# Patient Record
Sex: Female | Born: 1955 | Hispanic: Yes | Marital: Single | Smoking: Never smoker
Health system: Southern US, Community
[De-identification: ages and names within clinical notes are randomized; demographics above are authoritative.]

## PROBLEM LIST (undated history)

## (undated) DIAGNOSIS — I10 Essential (primary) hypertension: Secondary | ICD-10-CM

## (undated) DIAGNOSIS — E7800 Pure hypercholesterolemia, unspecified: Secondary | ICD-10-CM

## (undated) HISTORY — PX: SHOULDER SURGERY: SHX246

## (undated) HISTORY — DX: Pure hypercholesterolemia, unspecified: E78.00

## (undated) HISTORY — PX: FOOT SURGERY: SHX648

## (undated) HISTORY — PX: ABDOMINAL HYSTERECTOMY: SHX81

---

## 2013-08-12 ENCOUNTER — Emergency Department (HOSPITAL_COMMUNITY): Payer: Medicaid - Out of State

## 2013-08-12 ENCOUNTER — Encounter (HOSPITAL_COMMUNITY): Payer: Self-pay | Admitting: Emergency Medicine

## 2013-08-12 ENCOUNTER — Emergency Department (HOSPITAL_COMMUNITY)
Admission: EM | Admit: 2013-08-12 | Discharge: 2013-08-12 | Disposition: A | Discharge status: Home / Self Care | Payer: Medicaid - Out of State | Attending: Emergency Medicine | Admitting: Emergency Medicine

## 2013-08-12 DIAGNOSIS — R0602 Shortness of breath: Secondary | ICD-10-CM | POA: Insufficient documentation

## 2013-08-12 DIAGNOSIS — R079 Chest pain, unspecified: Secondary | ICD-10-CM

## 2013-08-12 DIAGNOSIS — I10 Essential (primary) hypertension: Secondary | ICD-10-CM | POA: Insufficient documentation

## 2013-08-12 HISTORY — DX: Essential (primary) hypertension: I10

## 2013-08-12 LAB — I-STAT TROPONIN, ED
TROPONIN I, POC: 0.01 ng/mL (ref 0.00–0.08)
Troponin i, poc: 0.01 ng/mL (ref 0.00–0.08)

## 2013-08-12 LAB — BASIC METABOLIC PANEL
Anion gap: 13 (ref 5–15)
BUN: 10 mg/dL (ref 6–23)
CALCIUM: 9.8 mg/dL (ref 8.4–10.5)
CO2: 26 meq/L (ref 19–32)
CREATININE: 0.72 mg/dL (ref 0.50–1.10)
Chloride: 102 mEq/L (ref 96–112)
GFR calc Af Amer: >90 mL/min (ref >90)
GLUCOSE: 112 mg/dL — AB (ref 70–99)
Potassium: 3.3 mEq/L — ABNORMAL LOW (ref 3.7–5.3)
Sodium: 141 mEq/L (ref 137–147)

## 2013-08-12 LAB — CBC
HCT: 36.4 % (ref 36.0–46.0)
HEMOGLOBIN: 12.8 g/dL (ref 12.0–15.0)
MCH: 30.3 pg (ref 26.0–34.0)
MCHC: 35.2 g/dL (ref 30.0–36.0)
MCV: 86.3 fL (ref 78.0–100.0)
Platelets: 262 10*3/uL (ref 150–400)
RBC: 4.22 MIL/uL (ref 3.87–5.11)
RDW: 13.2 % (ref 11.5–15.5)
WBC: 8.3 10*3/uL (ref 4.0–10.5)

## 2013-08-12 LAB — D-DIMER, QUANTITATIVE (NOT AT ARMC): D DIMER QUANT: 0.36 ug{FEU}/mL (ref 0.00–0.48)

## 2013-08-12 LAB — PRO B NATRIURETIC PEPTIDE: PRO B NATRI PEPTIDE: 47.7 pg/mL (ref 0–125)

## 2013-08-12 MED ORDER — NITROGLYCERIN 0.4 MG SL SUBL
0.4000 mg | SUBLINGUAL_TABLET | SUBLINGUAL | Status: AC | PRN
Start: 2013-08-12 — End: 2013-08-12
  Administered 2013-08-12 (×3): 0.4 mg via SUBLINGUAL
  Filled 2013-08-12: qty 1

## 2013-08-12 MED ORDER — ASPIRIN 81 MG PO CHEW
324.0000 mg | CHEWABLE_TABLET | Freq: Once | ORAL | Status: AC
  Administered 2013-08-12: 324 mg via ORAL
  Filled 2013-08-12: qty 4

## 2013-08-12 NOTE — ED Provider Notes (Signed)
Medical screening examination/treatment/procedure(s) were conducted as a shared visit with non-physician practitioner(s) and myself.  I personally evaluated the patient during the encounter.   EKG Interpretation   Date/Time:  Thursday August 12 2013 16:29:46 EDT Ventricular Rate:  76 PR Interval:  182 QRS Duration: 79 QT Interval:  380 QTC Calculation: 427 R Axis:   89 Text Interpretation:  Sinus rhythm Consider left atrial enlargement RSR'  in V1 or V2, right VCD or RVH Baseline wander in lead(s) V4 No previous  ECGs available Conf[facility] by [facility]  MD, Jonny RuizJOHN ([id number]) on 08/12/2013 5:27:00  PM     HEART 3  3 months of 3 episodes a month of resting substernal chest discomfort lasts greater than one hour every time she has it and can last for several hours at a time radiates to her neck worse with breathing nonexertional without shortness of breath without cough without tenderness on examination  Hurman HornJohn M Imogine Carvell, MD 08/13/13 2110

## 2013-08-12 NOTE — Progress Notes (Signed)
  CARE MANAGEMENT ED NOTE 08/12/2013  Patient:  [name] [name]   Account Number:  [id number]  Date Initiated:  08/12/2013  Documentation initiated by:  [name] [name]  Subjective/Objective Assessment:   58 yr old [place] pt c/o chest pain and pressure x 30 mins, pt also c/o SOB and headache but denies n/v. [facility] with no admissions at time of this note     Subjective/Objective Assessment Detail:   Pt and her female family member at her bed side confirms the pt has Medicaid of [place] but has not changed to Medicaid of [place] and will be continuing to reside in [place].     Action/Plan:   ED CM reviewed EPIC notes, clinicals ED CM spoke with pt & Female family member at bedside CM discussed process to change medicaid form state to state via DSS Provided [place] & a [place] of [place] [employer]   Action/Plan Detail:   Providers to assist in the choice of a new [facility] medicaid provider Encouraged to seek CM for further questions Also provided with [place] self [place] resources until change completed   Anticipated DC Date:       Status Recommendation to Physician:   Result of Recommendation:    Other ED Services  Consult [name] [name]    DC Planning Services  Other  PCP issues  Outpatient Services - Pt will follow up    Choice offered to / List presented to:            Status of service:  Completed, signed off  ED Comments:   ED Comments Detail:

## 2013-08-12 NOTE — ED Provider Notes (Signed)
CSN: [id number]     Arrival date & time 08/12/13  1621 History   First MD Initiated Contact with Patient 08/12/13 1640     Chief Complaint  Patient presents with  . Chest Pain     (Consider location/radiation/quality/duration/timing/severity/associated sxs/prior Treatment) The history is provided by the patient and medical records. The history is limited by a language barrier. A language interpreter was used.   This is a 58 y.o. M with PMH significant for HTN, presenting to the ED for chest pain.  Patient states pain started around 1530 this afternoon, described as a pressure from her sternum extending across the left side of her chest. She endorses associated shortness of breath. She states she feels as though there is someone sitting on her chest. There some radiation down her left arm. She denies any numbness or paresthesias. She denies any palpitations, dizziness, or weakness. The patient has no prior cardiac history. She is an infrequent smoker.  Patient does admit to she has had similar chest pain in the past, occuring anywhere from 1-3x a month.  States one episode lasted for almost 6 hours.  States this pain is slightly more intense than normal which is what brought her to the hospital.  Pt has never had CP formally evaluated.   Does not have a PCP in the area as [place] from [place] about [place] ago.  No intervention tried PTA.  Past Medical History  Diagnosis Date  . Hypertension    Past Surgical History  Procedure Laterality Date  . Shoulder surgery    . Abdominal hysterectomy    . Foot surgery     No family history on file. History  Substance Use Topics  . Smoking status: Never Smoker   . Smokeless tobacco: Not on file  . Alcohol Use: No   OB History   Grav Para Term Preterm Abortions TAB SAB Ect Mult Living                 Review of Systems  Respiratory: Positive for shortness of breath.   Cardiovascular: Positive for chest pain.  All other systems  reviewed and are negative.     Allergies  Review of patient's allergies indicates no known allergies.  Home Medications   Prior to Admission medications   Not on File   BP 147/84  Pulse 67  Temp(Src) 98.1 F (36.7 C) (Oral)  Resp 18  SpO2 100%  Physical Exam  Nursing note and vitals reviewed. Constitutional: She is oriented to person, place, and time. She appears well-developed and well-nourished. No distress.  HENT:  Head: Normocephalic and atraumatic.  Mouth/Throat: Oropharynx is clear and moist.  Eyes: Conjunctivae and EOM are normal. Pupils are equal, round, and reactive to light.  Neck: Normal range of motion. Neck supple.  Cardiovascular: Normal rate, regular rhythm and normal heart sounds.   Pulmonary/Chest: Effort normal and breath sounds normal. No respiratory distress. She has no wheezes.  Abdominal: Soft. Bowel sounds are normal. There is no tenderness. There is no guarding.  Musculoskeletal: Normal range of motion. She exhibits no edema.  Neurological: She is alert and oriented to person, place, and time.  Skin: Skin is warm and dry. She is not diaphoretic.  Psychiatric: She has a normal mood and affect.    ED Course  Procedures (including critical care time) Labs Review Labs Reviewed  BASIC METABOLIC PANEL - Abnormal; Notable for the following:    Potassium 3.3 (*)    Glucose, Bld  112 (*)    All other components within normal limits  CBC  PRO B NATRIURETIC PEPTIDE  D-DIMER, QUANTITATIVE  I-STAT TROPOININ, ED  I-STAT TROPOININ, ED    Imaging Review Dg Chest 2 View  08/12/2013   CLINICAL DATA:  Chest pain  EXAM: CHEST  2 VIEW  COMPARISON:  None.  FINDINGS: There is no edema or consolidation. The heart is upper normal in size with normal pulmonary vascularity. No adenopathy. There is upper thoracic levoscoliosis. There is mild degenerative change in the thoracic spine. No pneumothorax.  IMPRESSION: No edema or consolidation.   Electronically Signed    By: Bretta Bang M.D.   On: 08/12/2013 17:03     EKG Interpretation   Date/Time:  Thursday August 12 2013 16:29:46 EDT Ventricular Rate:  76 PR Interval:  182 QRS Duration: 79 QT Interval:  380 QTC Calculation: 427 R Axis:   89 Text Interpretation:  Sinus rhythm Consider left atrial enlargement RSR'  in V1 or V2, right VCD or RVH Baseline wander in lead(s) V4 No previous  ECGs available Confirmed by [facility]  MD, Jonny Ruiz ([id number]) on 08/12/2013 5:27:00  PM      MDM   Final diagnoses:  Chest pain, unspecified chest pain type   58 y.o. F with CP and SOB, sx onset 1530 this afternoon.  Hx HTN, no prior cardiac issues but has had similar pain in the past.  Story today moderately suspicious for ACS.  EKG NSR, no acute ischemic changes.  Labs and CXR pending at this time.  ASA and SL NTG given.  EKG sinus rhythm without ischemic change. Troponin is negative. Chest x-ray is clear. Labwork including d-dimer is WNL. After medications pain has resolved to 1/10, pt is feeling better.  She is low-moderate risk for cardiac disease, HEART score of 3.  Will obtain delta troponin.    Delta troponin negative. Patient currently pain-free and sleeping in her room, NAD. Low suspicion for ACS, PE, dissection, or other acute cardiac event at this time.  Feel patient is safe for discharge home with cardiology follow-up, referral provided to  heart care.  Discussed plan with patient, he/she acknowledged understanding and agreed with plan of care.  Return precautions given for new or worsening symptoms.  Case discussed with attending physician, Dr. Fonnie Jarvis, who personally evaluated patient and agrees with assessment and plan of care  Garlon Hatchet, PA-C 08/12/13 2151

## 2013-08-12 NOTE — ED Notes (Addendum)
Pt c/o chest pain and pressure x 30 mins, pt also c/o SOB and headache but denies n/v.

## 2013-08-12 NOTE — Discharge Instructions (Signed)
Continue home medications as directed. Follow-up with cardiology-- call and schedule appt. Return to the ED for new or worsening symptoms.

## 2013-09-08 LAB — HM MAMMOGRAPHY: HM Mammogram: NORMAL

## 2013-09-24 ENCOUNTER — Ambulatory Visit (INDEPENDENT_AMBULATORY_CARE_PROVIDER_SITE_OTHER): Payer: Self-pay | Admitting: Cardiology

## 2013-09-24 ENCOUNTER — Encounter: Payer: Self-pay | Admitting: Cardiology

## 2013-09-24 VITALS — BP 132/78 | HR 76 | Ht 62.0 in | Wt 149.0 lb

## 2013-09-24 DIAGNOSIS — R002 Palpitations: Secondary | ICD-10-CM | POA: Insufficient documentation

## 2013-09-24 DIAGNOSIS — R079 Chest pain, unspecified: Secondary | ICD-10-CM

## 2013-09-24 DIAGNOSIS — I10 Essential (primary) hypertension: Secondary | ICD-10-CM | POA: Insufficient documentation

## 2013-09-24 LAB — COMPREHENSIVE METABOLIC PANEL
ALT: 23 U/L (ref 0–35)
AST: 26 U/L (ref 0–37)
Albumin: 4.1 g/dL (ref 3.5–5.2)
Alkaline Phosphatase: 99 U/L (ref 39–117)
BUN: 11 mg/dL (ref 6–23)
CO2: 30 mEq/L (ref 19–32)
Calcium: 9.5 mg/dL (ref 8.4–10.5)
Chloride: 102 mEq/L (ref 96–112)
Creatinine, Ser: 0.8 mg/dL (ref 0.4–1.2)
GFR: 79.83 mL/min (ref >60.00)
Glucose, Bld: 110 mg/dL — ABNORMAL HIGH (ref 70–99)
Potassium: 3.4 mEq/L — ABNORMAL LOW (ref 3.5–5.1)
Sodium: 139 mEq/L (ref 135–145)
Total Bilirubin: 0.2 mg/dL (ref 0.2–1.2)
Total Protein: 7 g/dL (ref 6.0–8.3)

## 2013-09-24 LAB — TSH: TSH: 2.02 u[IU]/mL (ref 0.35–4.50)

## 2013-09-24 NOTE — Patient Instructions (Signed)
Your physician recommends that you continue on your current medications as directed. Please refer to the Current Medication list given to you today.  Your physician has requested that you have an echocardiogram. Echocardiography is a painless test that uses sound waves to create images of your heart. It provides your doctor with information about the size and shape of your heart and how well your heart's chambers and valves are working. This procedure takes approximately one hour. There are no restrictions for this procedure.    Your physician has requested that you have a stress echocardiogram. For further information please visit [email or web address] Please follow instruction sheet as given.    Your physician recommends that you return for lab work in: TODAY- TSH, NMR W LIPIDS, LIPO A, CMET    Your physician recommends that you schedule a follow-up appointment in: WITH [name] AT HER NEXT AVAILABLE APPOINTMENT

## 2013-09-24 NOTE — Progress Notes (Signed)
Patient ID: [name] [name], female   DOB: [birth year], 58 y.o.   MRN: [id number]    Patient Name: [name] [name] Date of Encounter: 09/24/2013  Primary Care Provider:  No primary provider on file. Primary Cardiologist:  Lars Masson   Problem List   Past Medical History  Diagnosis Date  . Hypertension    Past Surgical History  Procedure Laterality Date  . Shoulder surgery    . Abdominal hysterectomy    . Foot surgery      Allergies  No Known Allergies  HPI  58 year old female with no prior cardiac history who has been experiencing palpitations and chest pain. She has been experiencing non-exertional chest pain associated with SOB and dizziness. The pain feels like pressure and is retrosternal. There is radiation to her arms. They have been happening in the last 4 years. No obvious aggravating or alleviating factor. Sometimes she feels them while at work ([employer]). No syncope, LE edema, orthopnea, PND.   The patient is of [place] origin and moved here recently from [place]. She has h/o well controlled HTN only. No FH of CAD. Very distant h/o smoking.  Home Medications  Prior to Admission medications   Medication Sig Start Date End Date Taking? Authorizing Provider  aspirin 81 MG tablet Take 81 mg by mouth daily.   Yes Historical Provider, MD  benazepril (LOTENSIN) 20 MG tablet Take 20 mg by mouth daily.   Yes Historical Provider, MD  traZODone (DESYREL) 150 MG tablet Take 1 tablet by mouth daily. 08/09/13  Yes Historical Provider, MD    Family History  No family history on file.  Social History  History   Social History  . Marital Status: Single    Spouse Name: N/A    Number of Children: N/A  . Years of Education: N/A   Occupational History  . Not on file.   Social History Main Topics  . Smoking status: Never Smoker   . Smokeless tobacco: Not on file  . Alcohol Use: No  . Drug Use: Not on file  . Sexual Activity: Not on file   Other Topics Concern  . Not on  file   Social History Narrative  . No narrative on file     Review of Systems, as per HPI, otherwise negative General:  No chills, fever, night sweats or weight changes.  Cardiovascular:  No chest pain, dyspnea on exertion, edema, orthopnea, palpitations, paroxysmal nocturnal dyspnea. Dermatological: No rash, lesions/masses Respiratory: No cough, dyspnea Urologic: No hematuria, dysuria Abdominal:   No nausea, vomiting, diarrhea, bright red blood per rectum, melena, or hematemesis Neurologic:  No visual changes, wkns, changes in mental status. All other systems reviewed and are otherwise negative except as noted above.  Physical Exam  Blood pressure 132/78, pulse 76, height  (1.575 m), weight 149 lb (67.586 kg), SpO2 97.00%.  General: Pleasant, NAD Psych: Normal affect. Neuro: Alert and oriented X 3. Moves all extremities spontaneously. HEENT: Normal  Neck: Supple without bruits or JVD. Lungs:  Resp regular and unlabored, CTA. Heart: RRR no s3, s4, or murmurs. Abdomen: Soft, non-tender, non-distended, BS + x 4.  Extremities: No clubbing, cyanosis or edema. DP/PT/Radials 2+ and equal bilaterally.  Labs:  No results found for this basename: CKTOTAL, CKMB, TROPONINI,  in the last 72 hours Lab Results  Component Value Date   WBC 8.3 08/12/2013   HGB 12.8 08/12/2013   HCT 36.4 08/12/2013   MCV 86.3 08/12/2013   PLT 262 08/12/2013  Lab Results  Component Value Date   DDIMER 0.36 08/12/2013   No components found with this basename: POCBNP,     Component Value Date/Time   NA 141 08/12/2013 1658   K 3.3* 08/12/2013 1658   CL 102 08/12/2013 1658   CO2 26 08/12/2013 1658   GLUCOSE 112* 08/12/2013 1658   BUN 10 08/12/2013 1658   CREATININE 0.72 08/12/2013 1658   CALCIUM 9.8 08/12/2013 1658   GFRNONAA >90 08/12/2013 1658   GFRAA >90 08/12/2013 1658   No results found for this basename: CHOL, HDL, LDLCALC, TRIG    Accessory Clinical Findings  echocardiogram  ECG - SR, early  repolarization in inferior leads, non-specific T wave abnormalities    Assessment & Plan  59 year old female   1. Chest pain with SOB - we will order an echocardiogram and an exercisse stress echocardiogram  2. Palpitations - no associated symptoms, we will order CMP, TSH  3. Lipids - never checked - NMR and LPa today  4. HTN - well controlled on benazepril, continue  Follow up in 4 weeks    Lars Masson, MD, [facility] 09/24/2013, 3:25 PM

## 2013-09-26 LAB — NMR LIPOPROFILE WITH LIPIDS
Cholesterol, Total: 216 mg/dL — ABNORMAL HIGH (ref 100–199)
HDL Particle Number: 32.2 umol/L (ref >=30.5)
HDL Size: 9.5 nm (ref >=9.2)
HDL-C: 52 mg/dL (ref >39)
LDL Particle Number: 1762 nmol/L — ABNORMAL HIGH (ref <1000)
LDL Size: 20.3 nm (ref >=20.8)
LP-IR Score: 62 — ABNORMAL HIGH (ref <=45)
Large HDL-P: 11.8 umol/L (ref >=4.8)
Large VLDL-P: 22.9 nmol/L — ABNORMAL HIGH (ref <=2.7)
Small LDL Particle Number: 721 nmol/L — ABNORMAL HIGH (ref <=527)
Triglycerides: 425 mg/dL — ABNORMAL HIGH (ref 0–149)
VLDL Size: 62.2 nm — ABNORMAL HIGH (ref <=46.6)

## 2013-09-27 LAB — LIPOPROTEIN A (LPA): Lipoprotein (a): 6 mg/dL (ref 0–30)

## 2013-09-28 ENCOUNTER — Telehealth: Payer: Self-pay | Admitting: *Deleted

## 2013-09-28 DIAGNOSIS — E785 Hyperlipidemia, unspecified: Secondary | ICD-10-CM

## 2013-09-28 MED ORDER — ATORVASTATIN CALCIUM 40 MG PO TABS: 40.0000 mg | ORAL_TABLET | Freq: Every day | ORAL | Status: DC

## 2013-09-28 NOTE — Telephone Encounter (Signed)
Called pt to inform her of her recent lab results per Dr Delton See.  Daughter (EC) answered and stated she takes care of the pt.  Informed the Daughter that per Dr Delton See the patient has significantly elevated LDL and TAG, she needs to be started on atorvastatin 40 mg po daily, and come in for lab work (CMP in 3-4 weeks). Informed the Daughter that per Dr Delton See the pt had normal thyroid function.  Also per Dr Delton See she wants the pt to be educated on how to increase potassium in her diet.  Confirmed pharmacy of choice with Daughter.  Scheduled a lab appt for 10/20/13 to check CMP.  Educated the Daughter on different foods that are high in potassium and informed her that I will send her some resources in the mail on foods high in K and how to lower cholesterol through diet.  Daughter verbalized understanding, agrees with the plan, and states she will endorse new orders to the pt.

## 2013-10-06 ENCOUNTER — Other Ambulatory Visit (HOSPITAL_COMMUNITY): Payer: Self-pay | Admitting: Cardiology

## 2013-10-06 DIAGNOSIS — R079 Chest pain, unspecified: Secondary | ICD-10-CM

## 2013-10-07 ENCOUNTER — Ambulatory Visit (HOSPITAL_COMMUNITY): Payer: Self-pay | Attending: Internal Medicine

## 2013-10-07 ENCOUNTER — Telehealth: Payer: Self-pay | Admitting: *Deleted

## 2013-10-07 ENCOUNTER — Ambulatory Visit (HOSPITAL_BASED_OUTPATIENT_CLINIC_OR_DEPARTMENT_OTHER): Payer: Self-pay

## 2013-10-07 DIAGNOSIS — I519 Heart disease, unspecified: Secondary | ICD-10-CM | POA: Insufficient documentation

## 2013-10-07 DIAGNOSIS — I10 Essential (primary) hypertension: Secondary | ICD-10-CM | POA: Insufficient documentation

## 2013-10-07 DIAGNOSIS — R079 Chest pain, unspecified: Secondary | ICD-10-CM

## 2013-10-07 DIAGNOSIS — R072 Precordial pain: Secondary | ICD-10-CM | POA: Insufficient documentation

## 2013-10-07 DIAGNOSIS — R002 Palpitations: Secondary | ICD-10-CM | POA: Insufficient documentation

## 2013-10-07 DIAGNOSIS — R0989 Other specified symptoms and signs involving the circulatory and respiratory systems: Secondary | ICD-10-CM

## 2013-10-07 MED ORDER — HYDROCHLOROTHIAZIDE 25 MG PO TABS: 25.0000 mg | ORAL_TABLET | Freq: Every day | ORAL | Status: DC

## 2013-10-07 NOTE — Telephone Encounter (Signed)
Informed the pt of her stress test being negative for ischemia per Dr Delton See.  Informed the pt that her LVEF was normal, however, the heart muscle is thickened and there was significantly elevated BP during the stress test, and Dr Delton See recommends for the pt to start taking HCTZ 25 mg po daily.  Confirmed pharmacy of choice with the pt.  Pt verbalized understanding and agrees with this plan.

## 2013-10-07 NOTE — Progress Notes (Signed)
2D Echo completed. 10/07/2013 

## 2013-10-07 NOTE — Telephone Encounter (Signed)
Message copied by Loa Socks on Thu Oct 07, 2013  5:51 PM ------      Message from: Lars Masson      Created: Thu Oct 07, 2013  4:24 PM       Stress test is negative for ischemia. There is normal LVEF - function However, the heart musle is thickened and there was significantly elevated BP during stress test. I would add hydrochlorothiazide 25 mg po daily to his regimen.      KN ------

## 2013-10-07 NOTE — Progress Notes (Signed)
Stress echo completed 10/07/2013

## 2013-10-20 ENCOUNTER — Other Ambulatory Visit: Payer: Self-pay

## 2013-10-21 ENCOUNTER — Encounter: Payer: Self-pay | Admitting: Cardiology

## 2013-10-21 ENCOUNTER — Ambulatory Visit (INDEPENDENT_AMBULATORY_CARE_PROVIDER_SITE_OTHER): Payer: Self-pay | Admitting: Cardiology

## 2013-10-21 ENCOUNTER — Other Ambulatory Visit (INDEPENDENT_AMBULATORY_CARE_PROVIDER_SITE_OTHER): Payer: Self-pay | Admitting: *Deleted

## 2013-10-21 VITALS — BP 110/72 | HR 72 | Ht 62.0 in | Wt 147.0 lb

## 2013-10-21 DIAGNOSIS — E785 Hyperlipidemia, unspecified: Secondary | ICD-10-CM

## 2013-10-21 DIAGNOSIS — I10 Essential (primary) hypertension: Secondary | ICD-10-CM

## 2013-10-21 DIAGNOSIS — R0609 Other forms of dyspnea: Secondary | ICD-10-CM

## 2013-10-21 DIAGNOSIS — R079 Chest pain, unspecified: Secondary | ICD-10-CM

## 2013-10-21 DIAGNOSIS — R0600 Dyspnea, unspecified: Secondary | ICD-10-CM | POA: Insufficient documentation

## 2013-10-21 DIAGNOSIS — R002 Palpitations: Secondary | ICD-10-CM

## 2013-10-21 LAB — COMPREHENSIVE METABOLIC PANEL
ALT: 19 U/L (ref 0–35)
AST: 20 U/L (ref 0–37)
Albumin: 3.7 g/dL (ref 3.5–5.2)
Alkaline Phosphatase: 81 U/L (ref 39–117)
BUN: 14 mg/dL (ref 6–23)
CO2: 32 mEq/L (ref 19–32)
Calcium: 9.3 mg/dL (ref 8.4–10.5)
Chloride: 104 mEq/L (ref 96–112)
Creatinine, Ser: 0.6 mg/dL (ref 0.4–1.2)
GFR: 109.62 mL/min (ref >60.00)
Glucose, Bld: 86 mg/dL (ref 70–99)
Potassium: 3.9 mEq/L (ref 3.5–5.1)
Sodium: 142 mEq/L (ref 135–145)
Total Bilirubin: 0.8 mg/dL (ref 0.2–1.2)
Total Protein: 7.2 g/dL (ref 6.0–8.3)

## 2013-10-21 MED ORDER — ATORVASTATIN CALCIUM 40 MG PO TABS: 40.0000 mg | ORAL_TABLET | Freq: Every day | ORAL | Status: DC

## 2013-10-21 MED ORDER — HYDROCHLOROTHIAZIDE 25 MG PO TABS: 25.0000 mg | ORAL_TABLET | Freq: Every day | ORAL | Status: DC

## 2013-10-21 MED ORDER — NITROGLYCERIN 0.4 MG SL SUBL: 0.4000 mg | SUBLINGUAL_TABLET | SUBLINGUAL | Status: DC | PRN

## 2013-10-21 MED ORDER — LISINOPRIL 20 MG PO TABS: 20.0000 mg | ORAL_TABLET | Freq: Every day | ORAL | Status: DC

## 2013-10-21 NOTE — Progress Notes (Signed)
Patient ID: [name] [name], female   DOB: 08-19-1955, 58 y.o.   MRN: [id number]    Patient Name: [name] [name] Date of Encounter: 10/21/2013  Primary Care Provider:  No primary provider on file. Primary Cardiologist:  Lars MassonNELSON, [facility]   Problem List   Past Medical History  Diagnosis Date  . Hypertension    Past Surgical History  Procedure Laterality Date  . Shoulder surgery    . Abdominal hysterectomy    . Foot surgery      Allergies  No Known Allergies  HPI  58 year old female with no prior cardiac history who has been experiencing palpitations and chest pain. She has been experiencing non-exertional chest pain associated with SOB and dizziness. The pain feels like pressure and is retrosternal. There is radiation to her arms. They have been happening in the last 4 years. No obvious aggravating or alleviating factor. Sometimes she feels them while at work ([employer]). No syncope, LE edema, orthopnea, PND.   The patient is of [place] origin and moved here recently from [place]. She has h/o well controlled HTN only. No FH of CAD. Very distant h/o smoking.  Informed the pt of her stress test being negative for ischemia per Dr [name].  Informed the pt that her LVEF was normal, however, mild LH and grade 2 DD, and there was significantly elevated BP during the stress test (202 mmHg), started on HCTZ 25 mg po daily.  She still has occassional chest pain and DOE. She was not taking HCTZ as she was concerned about cost.   Home Medications  Prior to Admission medications   Medication Sig Start Date End Date Taking? Authorizing Provider  aspirin 81 MG tablet Take 81 mg by mouth daily.   Yes Historical Provider, MD  benazepril (LOTENSIN) 20 MG tablet Take 20 mg by mouth daily.   Yes Historical Provider, MD  traZODone (DESYREL) 150 MG tablet Take 1 tablet by mouth daily. 08/09/13  Yes Historical Provider, MD    Family History  No family history on file.  Social History  History    Social History  . Marital Status: Single    Spouse Name: N/A    Number of Children: N/A  . Years of Education: N/A   Occupational History  . Not on file.   Social History Main Topics  . Smoking status: Never Smoker   . Smokeless tobacco: Not on file  . Alcohol Use: No  . Drug Use: Not on file  . Sexual Activity: Not on file   Other Topics Concern  . Not on file   Social History Narrative  . No narrative on file     Review of Systems, as per HPI, otherwise negative General:  No chills, fever, night sweats or weight changes.  Cardiovascular:  No chest pain, dyspnea on exertion, edema, orthopnea, palpitations, paroxysmal nocturnal dyspnea. Dermatological: No rash, lesions/masses Respiratory: No cough, dyspnea Urologic: No hematuria, dysuria Abdominal:   No nausea, vomiting, diarrhea, bright red blood per rectum, melena, or hematemesis Neurologic:  No visual changes, wkns, changes in mental status. All other systems reviewed and are otherwise negative except as noted above.  Physical Exam  Blood pressure 110/72, pulse 72, height 5\' 2"  (1.575 m), weight 147 lb (66.679 kg), SpO2 97.00%.  General: Pleasant, NAD Psych: Normal affect. Neuro: Alert and oriented X 3. Moves all extremities spontaneously. HEENT: Normal  Neck: Supple without bruits or JVD. Lungs:  Resp regular and unlabored, CTA. Heart: RRR no s3, s4, or  murmurs. Abdomen: Soft, non-tender, non-distended, BS + x 4.  Extremities: No clubbing, cyanosis or edema. DP/PT/Radials 2+ and equal bilaterally.  Labs:  No results found for this basename: CKTOTAL, CKMB, TROPONINI,  in the last 72 hours Lab Results  Component Value Date   WBC 8.3 08/12/2013   HGB 12.8 08/12/2013   HCT 36.4 08/12/2013   MCV 86.3 08/12/2013   PLT 262 08/12/2013    Lab Results  Component Value Date   DDIMER 0.36 08/12/2013   No components found with this basename: POCBNP,     Component Value Date/Time   NA 139 09/24/2013 1555   K 3.4*  09/24/2013 1555   CL 102 09/24/2013 1555   CO2 30 09/24/2013 1555   GLUCOSE 110* 09/24/2013 1555   BUN 11 09/24/2013 1555   CREATININE 0.8 09/24/2013 1555   CALCIUM 9.5 09/24/2013 1555   PROT 7.0 09/24/2013 1555   ALBUMIN 4.1 09/24/2013 1555   AST 26 09/24/2013 1555   ALT 23 09/24/2013 1555   ALKPHOS 99 09/24/2013 1555   BILITOT 0.2 09/24/2013 1555   GFRNONAA >90 08/12/2013 1658   GFRAA >90 08/12/2013 1658   No results found for this basename: CHOL,  HDL,  LDLCALC,  TRIG    Accessory Clinical Findings  Echocardiogram - 10/07/13  - Left ventricle: The cavity size was normal. Wall thickness was increased in a pattern of mild LVH. Systolic function was normal. The estimated ejection fraction was in the range of 60% to 65%. Features are consistent with a pseudonormal left ventricular filling pattern, with concomitant abnormal relaxation and increased filling pressure (grade 2 diastolic dysfunction). - Mitral valve: There was mild regurgitation.  Stress echo 10/07/13 - Stress ECG conclusions: During peak stress there was 1mm of horizontal to upsloping ST segment depression in the inferolateral leads. During recovery there was 1mm of horizontal ST segment depression in the inferolateral leads. There were no stress arrhythmias or conduction abnormalities. [facility] scoring: exercise time of 8 min; maximum ST deviation of 1 mm; no angina; resulting score is 3. This score predicts a moderate risk of cardiac events. - Staged echo: Normal echo stress  Impressions: - Normal study after maximal exercise with false positive EKG for ischemia.  ECG - SR, early repolarization in inferior leads, non-specific T wave abnormalities    Assessment & Plan  58 year old female   1. Chronic diastolic CHF - grade 2 DD< mild LVH, we added HCTZ 25 mg PO daily, she was not taking it as she was concerned about cost.  Stress echo normal but SBP 202 mmHg.   2. Palpitations - no associated symptoms, we will order  CMP, TSH  3. Lipids - started on Lipitor 40 mg po daily.  4. HTN - added HCTZ 25 mg po daily, switched from benazepril to lisinopril 20 mg po daily for cost reduction.   Follow up in 6 months, CMP today, advised to exercise on a regular basis and to go [employer], [employer] to get the cheapest generic meds.    Lars Masson, MD, [facility] 10/21/2013, 9:30 AM

## 2013-10-21 NOTE — Patient Instructions (Addendum)
Your physician has recommended you make the following change in your medication:   STOP TAKING BENAZEPRIL NOW  PLEASE CONTINUE TAKING YOUR LIPITOR 40 MG DAILY AS PRESCRIBED  START TAKING LISINOPRIL 20 MG DAILY  START TAKING HYDROCHLOROTHIAZIDE 25 MG DAILY  [name] HAS PRESCRIBED FOR YOU TO TAKE NITROGLYCERIN (UNDER THE TONGUE) AS NEEDED FOR CHEST PAIN- PLEASE FOLLOW DIRECTIONS FOR THIS MEDICATION CAREFULLY   Your physician recommends that you return for lab work in: TODAY- (CMET)    Your physician wants you to follow-up in: 6 MONTHS WITH DR Johnell ComingsNELSON You will receive a reminder letter in the mail two months in advance. If you don't receive a letter, please call our office to schedule the follow-up appointment.

## 2014-03-16 ENCOUNTER — Ambulatory Visit: Payer: Self-pay | Admitting: Cardiology

## 2014-04-27 ENCOUNTER — Ambulatory Visit: Payer: Self-pay | Admitting: Cardiology

## 2014-05-04 ENCOUNTER — Encounter: Payer: Self-pay | Admitting: Cardiology

## 2014-05-04 ENCOUNTER — Telehealth: Payer: Self-pay | Admitting: Cardiology

## 2014-05-04 NOTE — Telephone Encounter (Signed)
ERROR

## 2014-05-10 ENCOUNTER — Encounter: Payer: Self-pay | Admitting: Family Medicine

## 2014-05-10 ENCOUNTER — Ambulatory Visit (INDEPENDENT_AMBULATORY_CARE_PROVIDER_SITE_OTHER): Payer: 59 | Admitting: Family Medicine

## 2014-05-10 VITALS — BP 118/79 | HR 77 | Ht 62.0 in | Wt 147.0 lb

## 2014-05-10 DIAGNOSIS — I10 Essential (primary) hypertension: Secondary | ICD-10-CM

## 2014-05-10 DIAGNOSIS — M25561 Pain in right knee: Secondary | ICD-10-CM | POA: Diagnosis not present

## 2014-05-10 DIAGNOSIS — R0981 Nasal congestion: Secondary | ICD-10-CM | POA: Diagnosis not present

## 2014-05-10 MED ORDER — HYDROCHLOROTHIAZIDE 25 MG PO TABS: 25.0000 mg | ORAL_TABLET | Freq: Every day | ORAL | Status: DC

## 2014-05-10 MED ORDER — IPRATROPIUM BROMIDE 0.06 % NA SOLN: 2.0000 | Freq: Three times a day (TID) | NASAL | Status: DC | PRN

## 2014-05-10 MED ORDER — ATORVASTATIN CALCIUM 40 MG PO TABS: 40.0000 mg | ORAL_TABLET | Freq: Every day | ORAL | Status: DC

## 2014-05-10 MED ORDER — LISINOPRIL 20 MG PO TABS: 20.0000 mg | ORAL_TABLET | Freq: Every day | ORAL | Status: DC

## 2014-05-10 MED ORDER — MELOXICAM 15 MG PO TABS: 15.0000 mg | ORAL_TABLET | Freq: Every day | ORAL | Status: DC | PRN

## 2014-05-10 NOTE — Progress Notes (Addendum)
CC: [name] [name] is a 59 y.o. female is here for Establish Care   Subjective: HPI:  Very pleasant 59 year old here to establish care predominantly Spanish-speaking  History of essential hypertension she tells me that this is been going on for at least a year now. She's done well with hydrochlorothiazide and lisinopril in the past. No outside blood pressures to report that this has controlled blood pressures in the past from what I can tell. She denies any known side effects recently or remotely. She's had chest pain in the past however she sees a cardiologist for this and has had no exertional chest pain since a stress echocardiogram which was negative for inducible ischemia. She would like refills on blood pressure medication and cholesterol.  Her major complaint today is right knee pain that has been present for 2 weeks. It's worse with going from a kneeling to standing or squatting position. It's located in the back of the knee and radiates up the proximal hamstring region. It's been present on a daily basis. Worsen longer she is on her feet. No interventions as of yet. Complains of crepitus but no other mechanical symptoms such as giving out or locking up. Occasional swelling but she has difficulty describing to what degree. No overlying redness or bruising. She denies any recent trauma. She specifically requesting to see a orthopedist  She also complains of nasal congestion that is severely interfering with her quality of life. She reports stuffiness and postnasal drip that occurs on a daily basis year round without any seasonal pattern. No benefit from a nasal steroid spray. She does not take that she's tried any other interventions as of yet. When symptoms are at their worst causes a nonproductive cough without shortness of breath. She is specifically requesting to see an ear nose and throat physician  Review of Systems - General ROS: negative for - chills, fever, night sweats, weight gain or  weight loss Ophthalmic ROS: negative for - decreased vision Psychological ROS: negative for - anxiety or depression ENT ROS: negative for - hearing change, tinnitus or allergies Hematological and Lymphatic ROS: negative for - bleeding problems, bruising or swollen lymph nodes Breast ROS: negative Respiratory ROS: no cough, shortness of breath, or wheezing Cardiovascular ROS: no chest pain or dyspnea on exertion Gastrointestinal ROS: no abdominal pain, change in bowel habits, or black or bloody stools Genito-Urinary ROS: negative for - genital discharge, genital ulcers, incontinence or abnormal bleeding from genitals Musculoskeletal ROS: negative for - joint pain or muscle pain other than that described above Neurological ROS: negative for - headaches or memory loss Dermatological ROS: negative for lumps, mole changes, rash and skin lesion changes  Past Medical History  Diagnosis Date  . Hypertension   . High cholesterol     Past Surgical History  Procedure Laterality Date  . Shoulder surgery    . Abdominal hysterectomy    . Foot surgery     Family History  Problem Relation Age of Onset  . Diabetes Mother   . High blood pressure Mother     History   Social History  . Marital Status: Single    Spouse Name: N/A  . Number of Children: N/A  . Years of Education: N/A   Occupational History  . Not on file.   Social History Main Topics  . Smoking status: Never Smoker   . Smokeless tobacco: Not on file  . Alcohol Use: No  . Drug Use: No  . Sexual Activity: Not on file  Other Topics Concern  . Not on file   Social History Narrative     Objective: BP 118/79 mmHg  Pulse 77  Ht  (1.575 m)  Wt 147 lb (66.679 kg)  BMI 26.88 kg/m2  General: Alert and Oriented, No Acute Distress HEENT: Pupils equal, round, reactive to light. Conjunctivae clear.  External ears unremarkable, canals clear with intact TMs with appropriate landmarks.  Middle ear appears open without  effusion. Pink inferior turbinates.  Moist mucous membranes, pharynx without inflammation nor lesions.  Neck supple without palpable lymphadenopathy nor abnormal masses. Lungs: Clear to auscultation bilaterally, no wheezing/ronchi/rales.  Comfortable work of breathing. Good air movement. Cardiac: Regular rate and rhythm. Normal S1/S2.  No murmurs, rubs, nor gallops.   Right knee exam shows full-strength and range of motion. There is no swelling, redness, nor warmth overlying the knee.  No patellar crepitus.  Reproduced popliteal space tenderness with palpation between the hamstrings, no palpable abnormality. No medial or lateral joint line tenderness to palpation. Full exam was declined by the patient due to fear of pain. Extremities: No peripheral edema.  Strong peripheral pulses.  Mental Status: No depression, anxiety, nor agitation. Skin: Warm and dry.  Assessment & Plan: [name] was seen today for establish care.  Diagnoses and all orders for this visit:  Essential hypertension  Right knee pain Orders: -     AMB referral to orthopedics  Nasal congestion Orders: -     Ambulatory referral to ENT  Other orders -     atorvastatin (LIPITOR) 40 MG tablet; Take 1 tablet (40 mg total) by mouth daily. -     hydrochlorothiazide (HYDRODIURIL) 25 MG tablet; Take 1 tablet (25 mg total) by mouth daily. -     lisinopril (PRINIVIL,ZESTRIL) 20 MG tablet; Take 1 tablet (20 mg total) by mouth daily. -     meloxicam (MOBIC) 15 MG tablet; Take 1 tablet (15 mg total) by mouth daily as needed for pain. -     ipratropium (ATROVENT) 0.06 % nasal spray; Place 2 sprays into both nostrils 3 (three) times daily as needed (mucus).   essential hypertension: Controlled continue lisinopril and hydrochlorothiazide Right knee pain: Begin meloxicam and referral to orthopedics per her request Nasal congestion: Start Atrovent and referral to ear nose and throat per her request    Return in about 3 months (around  08/09/2014) for Blood Pressure.

## 2014-05-18 ENCOUNTER — Telehealth: Payer: Self-pay | Admitting: Family Medicine

## 2014-05-18 DIAGNOSIS — R079 Chest pain, unspecified: Secondary | ICD-10-CM

## 2014-05-18 NOTE — Telephone Encounter (Signed)
Referral req

## 2014-05-27 ENCOUNTER — Ambulatory Visit (INDEPENDENT_AMBULATORY_CARE_PROVIDER_SITE_OTHER): Payer: 59 | Admitting: Cardiology

## 2014-05-27 ENCOUNTER — Encounter: Payer: Self-pay | Admitting: Cardiology

## 2014-05-27 VITALS — BP 128/72 | HR 81 | Ht 62.0 in | Wt 148.0 lb

## 2014-05-27 DIAGNOSIS — I10 Essential (primary) hypertension: Secondary | ICD-10-CM | POA: Diagnosis not present

## 2014-05-27 DIAGNOSIS — R072 Precordial pain: Secondary | ICD-10-CM

## 2014-05-27 DIAGNOSIS — G4730 Sleep apnea, unspecified: Secondary | ICD-10-CM | POA: Diagnosis not present

## 2014-05-27 MED ORDER — POTASSIUM CHLORIDE ER 10 MEQ PO TBCR: 10.0000 meq | EXTENDED_RELEASE_TABLET | Freq: Every day | ORAL | Status: DC

## 2014-05-27 MED ORDER — NITROGLYCERIN 0.4 MG SL SUBL: 0.4000 mg | SUBLINGUAL_TABLET | SUBLINGUAL | Status: DC | PRN

## 2014-05-27 MED ORDER — FUROSEMIDE 20 MG PO TABS: 20.0000 mg | ORAL_TABLET | Freq: Every day | ORAL | Status: DC

## 2014-05-27 NOTE — Progress Notes (Signed)
Patient ID: [name] AdasFelix Whetsell, female   DOB: Oct 30, 1955, 59 y.o.   MRN: [id number]    Patient Name: [name] [name] Date of Encounter: 05/27/2014  Primary Care Provider:  Laren BoomHommel, Sean, DO Primary Cardiologist:  Lars MassonNELSON, Barbar Brede H  Problem List   Past Medical History  Diagnosis Date  . Hypertension   . High cholesterol    Past Surgical History  Procedure Laterality Date  . Shoulder surgery    . Abdominal hysterectomy    . Foot surgery      Allergies  No Known Allergies  HPI  59 year old female with no prior cardiac history who has been experiencing palpitations and chest pain. She has been experiencing non-exertional chest pain associated with SOB and dizziness. The pain feels like pressure and is retrosternal. There is radiation to her arms. They have been happening in the last 4 years. No obvious aggravating or alleviating factor. Sometimes she feels them while at work ([employer]). No syncope, LE edema, orthopnea, PND.   The patient is of [place] origin and moved here recently from [place]. She has h/o well controlled HTN only. No FH of CAD. Very distant h/o smoking.  Informed the pt of her stress test being negative for ischemia per Dr [name].  Informed the pt that her LVEF was normal, however, mild LH and grade 2 DD, and there was significantly elevated BP during the stress test (202 mmHg), started on HCTZ 25 mg po daily.  She still has occassional chest pain and DOE. She was not taking HCTZ as she was concerned about cost.  05/27/2014 - the patient continues to complain for chest pain at night, can wake her up from sleep, about 3-4 /months for the last 6-7 night, also occassional PND. She is snoring and has pauses in breathing while she is asleep. She has mild DOE, no chest pain while walking. No palpitations or syncope.   Home Medications  Prior to Admission medications   Medication Sig Start Date End Date Taking? Authorizing Provider  aspirin 81 MG tablet Take 81 mg by mouth daily.    Yes Historical Provider, MD  benazepril (LOTENSIN) 20 MG tablet Take 20 mg by mouth daily.   Yes Historical Provider, MD  traZODone (DESYREL) 150 MG tablet Take 1 tablet by mouth daily. 08/09/13  Yes Historical Provider, MD    Family History  Family History  Problem Relation Age of Onset  . Diabetes Mother   . High blood pressure Mother     Social History  History   Social History  . Marital Status: Single    Spouse Name: N/A  . Number of Children: N/A  . Years of Education: N/A   Occupational History  . Not on file.   Social History Main Topics  . Smoking status: Never Smoker   . Smokeless tobacco: Not on file  . Alcohol Use: No  . Drug Use: No  . Sexual Activity: Not on file   Other Topics Concern  . Not on file   Social History Narrative     Review of Systems, as per HPI, otherwise negative General:  No chills, fever, night sweats or weight changes.  Cardiovascular:  No chest pain, dyspnea on exertion, edema, orthopnea, palpitations, paroxysmal nocturnal dyspnea. Dermatological: No rash, lesions/masses Respiratory: No cough, dyspnea Urologic: No hematuria, dysuria Abdominal:   No nausea, vomiting, diarrhea, bright red blood per rectum, melena, or hematemesis Neurologic:  No visual changes, wkns, changes in mental status. All other systems reviewed and are otherwise  negative except as noted above.  Physical Exam  Blood pressure 128/72, pulse 81, height 5\' 2"  (1.575 m), weight 148 lb (67.132 kg).  General: Pleasant, NAD Psych: Normal affect. Neuro: Alert and oriented X 3. Moves all extremities spontaneously. HEENT: Normal  Neck: Supple without bruits or JVD. Lungs:  Resp regular and unlabored, CTA. Heart: RRR no s3, s4, or murmurs. Abdomen: Soft, non-tender, non-distended, BS + x 4.  Extremities: No clubbing, cyanosis or edema. DP/PT/Radials 2+ and equal bilaterally.  Labs:  No results for input(s): CKTOTAL, CKMB, TROPONINI in the last 72 hours. Lab  Results  Component Value Date   WBC 8.3 08/12/2013   HGB 12.8 08/12/2013   HCT 36.4 08/12/2013   MCV 86.3 08/12/2013   PLT 262 08/12/2013    Lab Results  Component Value Date   DDIMER 0.36 08/12/2013   Invalid input(s): POCBNP    Component Value Date/Time   NA 142 10/21/2013 0908   K 3.9 10/21/2013 0908   CL 104 10/21/2013 0908   CO2 32 10/21/2013 0908   GLUCOSE 86 10/21/2013 0908   BUN 14 10/21/2013 0908   CREATININE 0.6 10/21/2013 0908   CALCIUM 9.3 10/21/2013 0908   PROT 7.2 10/21/2013 0908   ALBUMIN 3.7 10/21/2013 0908   AST 20 10/21/2013 0908   ALT 19 10/21/2013 0908   ALKPHOS 81 10/21/2013 0908   BILITOT 0.8 10/21/2013 0908   GFRNONAA >90 08/12/2013 1658   GFRAA >90 08/12/2013 1658   Lab Results  Component Value Date   CHOL 216* 09/24/2013    Accessory Clinical Findings  Echocardiogram - 10/07/13  - Left ventricle: The cavity size was normal. Wall thickness was increased in a pattern of mild LVH. Systolic function was normal. The estimated ejection fraction was in the range of 60% to 65%. Features are consistent with a pseudonormal left ventricular filling pattern, with concomitant abnormal relaxation and increased filling pressure (grade 2 diastolic dysfunction). - Mitral valve: There was mild regurgitation.  Stress echo 10/07/13 - Stress ECG conclusions: During peak stress there was 1mm of horizontal to upsloping ST segment depression in the inferolateral leads. During recovery there was 1mm of horizontal ST segment depression in the inferolateral leads. There were no stress arrhythmias or conduction abnormalities. Duke scoring: exercise time of 8 min; maximum ST deviation of 1 mm; no angina; resulting score is 3. This score predicts a moderate risk of cardiac events. - Staged echo: Normal echo stress  Impressions: - Normal study after maximal exercise with false positive EKG for ischemia.  ECG - SR, early repolarization in inferior leads,  non-specific T wave abnormalities    Assessment & Plan  59 year old female   1. Chronic diastolic CHF - grade 2 DD, mild LVH, we d/c HCTZ 25 mg PO daily, start lasix 40 mg po daily and KCL 10 mEq daily, Stress echo normal but SBP 202 mmHg.   2. Lipids - started on Lipitor 40 mg po daily.  3. HTN - as above  4. Sleep apnea - we will refer for a sleep study  Follow up in 3 months with BMP.   Lars MassonNELSON, Baylie Drakes H, MD, [facility] 05/27/2014, 3:29 PM

## 2014-05-27 NOTE — Patient Instructions (Signed)
Medication Instructions:   STOP TAKING HYDROCHLOROTHIAZIDE NOW  START TAKING LASIX 20 MG ONCE DAILY  START TAKING POTASSIUM CHLORIDE 10 mEq ONCE DAILY  PLEASE PICK UP YOUR NITROGLYCERIN 0.4 MG SL AND USE THIS AS NEEDED FOR CHEST PAIN---PLEASE FOLLOW INSTRUCTIONS CAREFULLY    Labwork:  3 MONTHS TO CHECK A BMET--SCHEDULE THIS PRIOR TO YOUR 3 MONTH FOLLOW-UP APPOINTMENT WITH DR [name]    Your physician has recommended that you have a sleep study. This test records several body functions during sleep, including: brain activity, eye movement, oxygen and carbon dioxide blood levels, heart rate and rhythm, breathing rate and rhythm, the flow of air through your mouth and nose, snoring, body muscle movements, and chest and belly movement.     Follow-Up:   3 MONTHS WITH [name]---PLEASE HAVE YOUR LAB DONE PRIOR TO THIS APPOINTMENT

## 2014-06-09 ENCOUNTER — Telehealth: Payer: Self-pay | Admitting: *Deleted

## 2014-06-09 DIAGNOSIS — M25551 Pain in right hip: Secondary | ICD-10-CM

## 2014-06-09 NOTE — Telephone Encounter (Signed)
Shanda BumpsJessica ( pt's daughter?) called and left message that the Dr. She saw at [facility] wanted Dr. Ivan Anchorshommel to refer her to another doctor, Dr. Jarold Songhandler Long. I called [facility] and left a message asking that they fax us over the office notes so we can be clear as to what we need to do on our end

## 2014-06-10 ENCOUNTER — Encounter: Payer: Self-pay | Admitting: Family Medicine

## 2014-06-10 DIAGNOSIS — M25551 Pain in right hip: Secondary | ICD-10-CM | POA: Insufficient documentation

## 2014-06-10 NOTE — Addendum Note (Signed)
Addended by: Laren BoomHOMMEL, Zafar Debrosse on: 06/10/2014 08:48 AM   Modules accepted: Orders

## 2014-06-10 NOTE — Telephone Encounter (Signed)
Sue Lushndrea, Will you please let patient or daughter know that Dr. Veda Canninghandler's note lists a request for a referral to [facility]egin[facility]ult[facility]Wang [facility] and I've placed a referral for this.  It looks like Dr. Ave Filterhandler wants [name] [name] to get an injection performed by Dr. Regino SchultzeWang.

## 2014-06-10 NOTE — Telephone Encounter (Signed)
Daughter notified 

## 2014-07-13 ENCOUNTER — Encounter (HOSPITAL_BASED_OUTPATIENT_CLINIC_OR_DEPARTMENT_OTHER): Payer: 59

## 2014-07-26 ENCOUNTER — Ambulatory Visit (HOSPITAL_BASED_OUTPATIENT_CLINIC_OR_DEPARTMENT_OTHER): Payer: 59 | Attending: Cardiology | Admitting: Radiology

## 2014-07-26 VITALS — Ht 62.0 in | Wt 150.0 lb

## 2014-07-26 DIAGNOSIS — I10 Essential (primary) hypertension: Secondary | ICD-10-CM

## 2014-07-26 DIAGNOSIS — G4730 Sleep apnea, unspecified: Secondary | ICD-10-CM | POA: Diagnosis not present

## 2014-07-26 DIAGNOSIS — R0683 Snoring: Secondary | ICD-10-CM | POA: Insufficient documentation

## 2014-07-26 DIAGNOSIS — I493 Ventricular premature depolarization: Secondary | ICD-10-CM | POA: Diagnosis not present

## 2014-07-26 DIAGNOSIS — R072 Precordial pain: Secondary | ICD-10-CM

## 2014-07-26 DIAGNOSIS — G4733 Obstructive sleep apnea (adult) (pediatric): Secondary | ICD-10-CM | POA: Diagnosis present

## 2014-08-19 ENCOUNTER — Telehealth: Payer: Self-pay | Admitting: Cardiology

## 2014-08-19 NOTE — Telephone Encounter (Signed)
Patient is aware of results and stated verbal understanding.  She said that she already has an appointment with an ENT later this month.

## 2014-08-19 NOTE — Telephone Encounter (Signed)
Please let patient know that sleep study showed no significant sleep apnea.  There was severe snoring so recommend consideration of ENT referral.

## 2014-08-19 NOTE — Sleep Study (Signed)
SLEEP STUDY     Patient Name: [name] [name], [name] [name] Date: 07/26/2014 MRN: [id number] Gender: Female D.O.B: [birth year] Age (years): 89 Referring Provider: Lars Masson Interpreting Physician: Armanda Magic MD, ABSM RPSGT: Armen Pickup   Height (inches): 62 Weight (lbs): 150 BMI: 27 Neck Size: 15.50  CLINICAL INFORMATION Sleep Study Type: NPSG  Indication for sleep study: OSA  Epworth Sleepiness Score:2  SLEEP STUDY TECHNIQUE As per the AASM Manual for the Scoring of Sleep and Associated Events v2.3 (April 2016) with a hypopnea requiring 4% desaturations.  The channels recorded and monitored were frontal, central and occipital EEG, electrooculogram (EOG), submentalis EMG (chin), nasal and oral airflow, thoracic and abdominal wall motion, anterior tibialis EMG, snore microphone, electrocardiogram, and pulse oximetry.  MEDICATIONS Patient's medications include: ASA, Lipitor, Lasix, Atrovent, Lisinopril, Meloxicam, Kdur. Medications self-administered by patient during sleep study : Sleep medicine administered - Unspecified at 12:38:52 AM  SLEEP ARCHITECTURE The study was initiated at 10:34:20 PM and ended at 5:06:17 AM.  Sleep onset time was 12.5 minutes and the sleep efficiency was 86.7%. The total sleep time was 340.0 minutes.  Stage REM latency was prolonged at 160.5 minutes.  The patient spent 5.59% of the night in stage N1 sleep, 65.74% in stage N2 sleep, 22.94% in stage N3 and 5.74% in REM.  Alpha intrusion was absent.  Supine sleep was 38.38%.  RESPIRATORY PARAMETERS The overall apnea/hypopnea index (AHI) was 0.9 per hour. There were 3 total apneas, including 3 obstructive, 0 central and 0 mixed apneas. There were 2 hypopneas and 3 RERAs.  The AHI during Stage REM sleep was 15.4 per hour.  AHI while supine was 0.9 per hour.  The mean oxygen saturation was 97.14%. The minimum SpO2 during sleep was 81.00%.  Loud snoring was noted during this  study.  CARDIAC DATA The 2 lead EKG demonstrated sinus rhythm. The mean heart rate was 65.56 beats per minute. Other EKG findings include: PVCs.  LEG MOVEMENT DATA The total PLMS were 0 with a resulting PLMS index of 0.00. Associated arousal with leg movement index was 0.0 .  IMPRESSIONS No significant obstructive sleep apnea occurred during this study (AHI = 0.9/h). No significant central sleep apnea occurred during this study (CAI = 0.0/h). Mild oxygen desaturation was noted during this study (Min O2 = 81.00%). The patient snored with Loud snoring volume. EKG findings include PVCs. Clinically significant periodic limb movements did not occur during sleep. No significant associated arousals.  DIAGNOSIS Snoring  RECOMMENDATIONS Avoid alcohol, sedatives and other CNS depressants that may cause sleep apnea and disrupt normal sleep architecture. Sleep hygiene should be reviewed to assess factors that may improve sleep quality. Weight management and regular exercise should be initiated or continued if appropriate. Consider referral to ENT for evaluation of surgical causes of snoring.  Signed: Armanda Magic, MD ABSM Diplomat, American Board of Sleep Medicine 08/19/2014

## 2014-08-23 ENCOUNTER — Other Ambulatory Visit (INDEPENDENT_AMBULATORY_CARE_PROVIDER_SITE_OTHER): Payer: 59 | Admitting: *Deleted

## 2014-08-23 ENCOUNTER — Encounter: Payer: Self-pay | Admitting: Family Medicine

## 2014-08-23 DIAGNOSIS — G4730 Sleep apnea, unspecified: Secondary | ICD-10-CM

## 2014-08-23 DIAGNOSIS — I10 Essential (primary) hypertension: Secondary | ICD-10-CM

## 2014-08-23 DIAGNOSIS — R072 Precordial pain: Secondary | ICD-10-CM | POA: Diagnosis not present

## 2014-08-23 DIAGNOSIS — R0683 Snoring: Secondary | ICD-10-CM | POA: Insufficient documentation

## 2014-08-23 LAB — BASIC METABOLIC PANEL
BUN: 15 mg/dL (ref 6–23)
CO2: 32 mEq/L (ref 19–32)
Calcium: 9.5 mg/dL (ref 8.4–10.5)
Chloride: 104 mEq/L (ref 96–112)
Creatinine, Ser: 0.67 mg/dL (ref 0.40–1.20)
GFR: 96.23 mL/min (ref >60.00)
Glucose, Bld: 121 mg/dL — ABNORMAL HIGH (ref 70–99)
Potassium: 3.6 mEq/L (ref 3.5–5.1)
Sodium: 142 mEq/L (ref 135–145)

## 2014-08-29 ENCOUNTER — Encounter: Payer: Self-pay | Admitting: Cardiology

## 2014-08-29 ENCOUNTER — Ambulatory Visit (INDEPENDENT_AMBULATORY_CARE_PROVIDER_SITE_OTHER): Payer: 59 | Admitting: Cardiology

## 2014-08-29 VITALS — BP 132/62 | HR 74 | Ht 62.0 in | Wt 149.0 lb

## 2014-08-29 DIAGNOSIS — G4730 Sleep apnea, unspecified: Secondary | ICD-10-CM

## 2014-08-29 DIAGNOSIS — M3390 Dermatopolymyositis, unspecified, organ involvement unspecified: Secondary | ICD-10-CM

## 2014-08-29 DIAGNOSIS — R072 Precordial pain: Secondary | ICD-10-CM | POA: Diagnosis not present

## 2014-08-29 DIAGNOSIS — I10 Essential (primary) hypertension: Secondary | ICD-10-CM | POA: Diagnosis not present

## 2014-08-29 DIAGNOSIS — R002 Palpitations: Secondary | ICD-10-CM | POA: Diagnosis not present

## 2014-08-29 DIAGNOSIS — Z13220 Encounter for screening for lipoid disorders: Secondary | ICD-10-CM

## 2014-08-29 DIAGNOSIS — R0789 Other chest pain: Secondary | ICD-10-CM

## 2014-08-29 MED ORDER — POTASSIUM CHLORIDE ER 10 MEQ PO TBCR: 10.0000 meq | EXTENDED_RELEASE_TABLET | Freq: Every day | ORAL | Status: DC

## 2014-08-29 MED ORDER — ISOSORBIDE MONONITRATE ER 30 MG PO TB24: 15.0000 mg | ORAL_TABLET | Freq: Every day | ORAL | Status: DC

## 2014-08-29 NOTE — Patient Instructions (Addendum)
**Note De-Identified Marzelle Rutten Obfuscation** Medication Instructions:  Start taking Imdur 15 mg daily  Labwork: Tomorrow (Lipids, LFT's and HGB A1c  Testing/Procedures:  Your physician has requested that you have cardiac CT. Cardiac computed tomography (CT) is a painless test that uses an x-ray machine to take clear, detailed pictures of your heart. For further information please visit [email or web address] Please follow instruction sheet as given. Please schedule to be done tomorrow or Wednesday with Dr Delton See.   Follow-Up: Your physician wants you to follow-up in: 6 months. You will receive a reminder letter in the mail two months in advance. If you don't receive a letter, please call our office to schedule the follow-up appointment.

## 2014-08-29 NOTE — Progress Notes (Signed)
Patient ID: Pernell Dikes, female   DOB: 10-26-1955, 59 y.o.   MRN: [id number]    Patient Name: [name] [name] Date of Encounter: 08/29/2014  Primary Care Provider:  Laren Boom, DO Primary Cardiologist:  Lars Masson  Problem List   Past Medical History  Diagnosis Date  . Hypertension   . High cholesterol    Past Surgical History  Procedure Laterality Date  . Shoulder surgery    . Abdominal hysterectomy    . Foot surgery     Allergies  No Known Allergies  Chief complain: Chest pain  HPI  59 year old female with no prior cardiac history who has been experiencing palpitations and chest pain. She has been experiencing non-exertional chest pain associated with SOB and dizziness. The pain feels like pressure and is retrosternal. There is radiation to her arms. They have been happening in the last 4 years. No obvious aggravating or alleviating factor. Sometimes she feels them while at work ([employer]). No syncope, LE edema, orthopnea, PND.   The patient is of [place] origin and moved here recently from [place]. She has h/o well controlled HTN only. No FH of CAD. Very distant h/o smoking.  Informed the pt of her stress test being negative for ischemia per Dr Delton See.  Informed the pt that her LVEF was normal, however, mild LH and grade 2 DD, and there was significantly elevated BP during the stress test (202 mmHg), started on HCTZ 25 mg po daily.  She still has occassional chest pain and DOE. She was not taking HCTZ as she was concerned about cost.  05/27/2014 - the patient continues to complain for chest pain at night, can wake her up from sleep, about 3-4 /months for the last 6-7 night, also occassional PND. She is snoring and has pauses in breathing while she is asleep. She has mild DOE, no chest pain while walking. No palpitations or syncope.  08/29/14 - 3 months follow up, improved LE edema that s almost resolved, continues to have non-exertional tearing chest pains, improved DOE. No  palpitations or syncope. She underwent a sleep study and a CPAP was not recommended.   Home Medications  Prior to Admission medications   Medication Sig Start Date End Date Taking? Authorizing Provider  aspirin 81 MG tablet Take 81 mg by mouth daily.   Yes Historical Provider, MD  benazepril (LOTENSIN) 20 MG tablet Take 20 mg by mouth daily.   Yes Historical Provider, MD  traZODone (DESYREL) 150 MG tablet Take 1 tablet by mouth daily. 08/09/13  Yes Historical Provider, MD    Family History  Family History  Problem Relation Age of Onset  . Diabetes Mother   . High blood pressure Mother     Social History  Social History   Social History  . Marital Status: Single    Spouse Name: N/A  . Number of Children: N/A  . Years of Education: N/A   Occupational History  . Not on file.   Social History Main Topics  . Smoking status: Never Smoker   . Smokeless tobacco: Not on file  . Alcohol Use: No  . Drug Use: No  . Sexual Activity: Not on file   Other Topics Concern  . Not on file   Social History Narrative     Review of Systems, as per HPI, otherwise negative General:  No chills, fever, night sweats or weight changes.  Cardiovascular:  No chest pain, dyspnea on exertion, edema, orthopnea, palpitations, paroxysmal nocturnal dyspnea. Dermatological:  No rash, lesions/masses Respiratory: No cough, dyspnea Urologic: No hematuria, dysuria Abdominal:   No nausea, vomiting, diarrhea, bright red blood per rectum, melena, or hematemesis Neurologic:  No visual changes, wkns, changes in mental status. All other systems reviewed and are otherwise negative except as noted above.  Physical Exam  Blood pressure 132/62, pulse 74, height  (1.575 m).  General: Pleasant, NAD Psych: Normal affect. Neuro: Alert and oriented X 3. Moves all extremities spontaneously. HEENT: Normal  Neck: Supple without bruits or JVD. Lungs:  Resp regular and unlabored, CTA. Heart: RRR no s3, s4, or  murmurs. Abdomen: Soft, non-tender, non-distended, BS + x 4.  Extremities: No clubbing, cyanosis or edema. DP/PT/Radials 2+ and equal bilaterally.  Labs:  No results for input(s): CKTOTAL, CKMB, TROPONINI in the last 72 hours. Lab Results  Component Value Date   WBC 8.3 08/12/2013   HGB 12.8 08/12/2013   HCT 36.4 08/12/2013   MCV 86.3 08/12/2013   PLT 262 08/12/2013    Lab Results  Component Value Date   DDIMER 0.36 08/12/2013   Invalid input(s): POCBNP    Component Value Date/Time   NA 142 08/23/2014 1349   K 3.6 08/23/2014 1349   CL 104 08/23/2014 1349   CO2 32 08/23/2014 1349   GLUCOSE 121* 08/23/2014 1349   BUN 15 08/23/2014 1349   CREATININE 0.67 08/23/2014 1349   CALCIUM 9.5 08/23/2014 1349   PROT 7.2 10/21/2013 0908   ALBUMIN 3.7 10/21/2013 0908   AST 20 10/21/2013 0908   ALT 19 10/21/2013 0908   ALKPHOS 81 10/21/2013 0908   BILITOT 0.8 10/21/2013 0908   GFRNONAA >90 08/12/2013 1658   GFRAA >90 08/12/2013 1658   Lab Results  Component Value Date   CHOL 216* 09/24/2013   Accessory Clinical Findings  Echocardiogram - 10/07/13  - Left ventricle: The cavity size was normal. Wall thickness was increased in a pattern of mild LVH. Systolic function was normal. The estimated ejection fraction was in the range of 60% to 65%. Features are consistent with a pseudonormal left ventricular filling pattern, with concomitant abnormal relaxation and increased filling pressure (grade 2 diastolic dysfunction). - Mitral valve: There was mild regurgitation.  Stress echo 10/07/13  - Stress ECG conclusions: During peak stress there was 1mm of horizontal to upsloping ST segment depression in the inferolateral leads. During recovery there was 1mm of horizontal ST segment depression in the inferolateral leads. There were no stress arrhythmias or conduction abnormalities. Duke scoring: exercise time of 8 min; maximum ST deviation of 1 mm; no angina; resulting score is 3. This  score predicts a moderate risk of cardiac events. - Staged echo: Normal echo stress  Impressions: - Normal study after maximal exercise with false positive EKG for ischemia.  ECG - SR, new negative T waves in the lateral leads.    Assessment & Plan  59 year old female   1. Chronic diastolic CHF - grade 2 DD, mild LVH, we d/c HCTZ 25 mg PO daily, start lasix 40 mg po daily and KCL 10 mEq daily, Stress echo normal but SBP 202 mmHg.   2. Lipids - started on Lipitor 40 mg po daily. We will recheck lipids now with LFTs.  3. Chest pain - start imdur 15 mg po daily in case she has coronary spasm. We will order a cardiac CT since she continues having significant chest pains despite negative stress test and now she has new negative T waves in the lateral leads.  4. HTN - as above  5. Sleep apnea - no significant sleep apnea on a sleep study.  6. Elevated blood glucose - order HbA1c  Follow up in 6 months.  Lars Masson, MD, [facility] 08/29/2014, 2:38 PM

## 2014-08-30 ENCOUNTER — Other Ambulatory Visit: Payer: 59

## 2014-08-30 ENCOUNTER — Encounter: Payer: Self-pay | Admitting: Cardiology

## 2014-08-30 ENCOUNTER — Other Ambulatory Visit (INDEPENDENT_AMBULATORY_CARE_PROVIDER_SITE_OTHER): Payer: 59 | Admitting: *Deleted

## 2014-08-30 DIAGNOSIS — M3390 Dermatopolymyositis, unspecified, organ involvement unspecified: Secondary | ICD-10-CM

## 2014-08-30 DIAGNOSIS — Z13220 Encounter for screening for lipoid disorders: Secondary | ICD-10-CM | POA: Diagnosis not present

## 2014-08-30 DIAGNOSIS — I10 Essential (primary) hypertension: Secondary | ICD-10-CM | POA: Diagnosis not present

## 2014-08-30 LAB — HEPATIC FUNCTION PANEL
ALT: 16 U/L (ref 0–35)
AST: 20 U/L (ref 0–37)
Albumin: 4.4 g/dL (ref 3.5–5.2)
Alkaline Phosphatase: 84 U/L (ref 39–117)
Bilirubin, Direct: 0.1 mg/dL (ref 0.0–0.3)
Total Bilirubin: 0.6 mg/dL (ref 0.2–1.2)
Total Protein: 7.1 g/dL (ref 6.0–8.3)

## 2014-08-30 LAB — LIPID PANEL
Cholesterol: 138 mg/dL (ref 0–200)
HDL: 51 mg/dL (ref >39.00)
LDL Cholesterol: 65 mg/dL (ref 0–99)
NonHDL: 87.24
Total CHOL/HDL Ratio: 3
Triglycerides: 109 mg/dL (ref 0.0–149.0)
VLDL: 21.8 mg/dL (ref 0.0–40.0)

## 2014-08-30 LAB — HEMOGLOBIN A1C: Hgb A1c MFr Bld: 5.3 % (ref 4.6–6.5)

## 2014-08-30 NOTE — Addendum Note (Signed)
Addended by: Demetrios Loll on: 08/30/2014 09:52 AM   Modules accepted: Orders

## 2014-08-30 NOTE — Addendum Note (Signed)
Addended by: Tonita Phoenix on: 08/30/2014 09:50 AM   Modules accepted: Orders

## 2014-08-31 ENCOUNTER — Ambulatory Visit (HOSPITAL_COMMUNITY): Payer: 59 | Attending: Cardiology

## 2014-08-31 NOTE — Progress Notes (Signed)
Attempted to call patient at phone numbers listed no answer and no longer employee at work number listed. Contacted Dr. Delton See order provider to let her know no show.

## 2014-09-14 ENCOUNTER — Telehealth: Payer: Self-pay | Admitting: Cardiology

## 2014-09-14 NOTE — Telephone Encounter (Signed)
Patient was schedule to have Cardiac  Ct on 08-31-14.  Patient cancel due to she don't have insurance and don't want to reschedule.

## 2014-09-14 NOTE — Telephone Encounter (Signed)
Will route this message to Dr Delton See to make her aware of cancelled cardiac ct and not wanting to reschedule this test.

## 2015-03-09 ENCOUNTER — Other Ambulatory Visit: Payer: Self-pay | Admitting: *Deleted

## 2015-03-09 MED ORDER — LISINOPRIL 20 MG PO TABS: 20.0000 mg | ORAL_TABLET | Freq: Every day | ORAL | Status: DC

## 2015-07-13 ENCOUNTER — Other Ambulatory Visit: Payer: Self-pay | Admitting: Family Medicine

## 2015-07-14 ENCOUNTER — Other Ambulatory Visit: Payer: Self-pay

## 2015-07-14 DIAGNOSIS — I10 Essential (primary) hypertension: Secondary | ICD-10-CM

## 2015-07-14 DIAGNOSIS — R072 Precordial pain: Secondary | ICD-10-CM

## 2015-07-14 DIAGNOSIS — G4730 Sleep apnea, unspecified: Secondary | ICD-10-CM

## 2015-07-14 NOTE — Telephone Encounter (Signed)
Per Dr [name], please refill all meds she prescribes and refill lasix for 20 mg po daily.  Thank you!

## 2015-07-14 NOTE — Telephone Encounter (Signed)
It appears that the pt has been non-compliant with taking her lasix everyday.  Given the last time the pt has refilled her lasix, she should have been out months ago.  Also noted in Dr Lindaann SloughNelson's last OV with the pt, his HCTZ was discontinued, and it appears lasix 20 mg po daily and KCL 10 mEq po daily was ordered, but assessment and plan note states different.  Will need to obtain a clarification order per Dr [name] on whether she would like the pt to continue taking lasix 20 mg po daily, and be compliant with this, or should she increase this to lasix 40 mg po daily, as mentioned in her last OV note.  Will follow-up with the pt accordingly after she advises.

## 2015-07-14 NOTE — Telephone Encounter (Signed)
Please refill all

## 2015-07-14 NOTE — Telephone Encounter (Signed)
[name] MassonKatarina H Nelson, MD at 08/29/2014 2:38 PM  Assessment & Plan  60 year old female   1. Chronic diastolic CHF - grade 2 DD, mild LVH, we d/c HCTZ 25 mg PO daily, start lasix 40 mg po daily and KCL 10 mEq daily, Stress echo normal but SBP 202 mmHg.

## 2015-07-14 NOTE — Telephone Encounter (Signed)
Reached out to the pt and daughter (who is on DPR and translates for the pt) that our office received a refill fax from the pts pharmacy, to refill lasix 20 mg po daily.  Informed the daughter that it appears that the pt has not taken lasix at all in quite sometime, for the last time the pt refilled this med was in 05/2014.  Asked the daughter is there a reason why the pt stopped taking this medication for months?   Also asked the daughter is the pt symptomatic at this time, and that's why they are now just asking to get this med refilled? Per the pts daughter (who is beside the pt), she has not taken her lasix for months because she went out of the country to visit family for that time frame.  Daughter also states that the pt is asymptomatic at this time, and has had no cardiac complaints, sob, doe, or swelling at this time, or at the time frame she was to be taking this medication daily.  Daughter states that the pt just returned back to [place], and they are trying to get the pt on track and get her daily medications filled. Informed the pts daughter that she should be taking her lasix everyday, symptomatic or asymptomatic.  Advised the daughter that if the pt leaves the State, she should notify our office, so we can refill her medication accordingly, so pt can have enough meds on-hand. Informed the daughter that given she is asymptomatic and hasn't been taking this medication for months now, I will route this message to Dr [name] to review and advise on what dose of lasix she wants the pt to take.  Informed the daughter that Dr [name] may want get some baseline labs, prior too advising on dosage of pts lasix, being she has been non-compliant with taking this med, and all her other meds.  Informed the pts daughter that once Dr [name] gives recommendations, I will follow-up with the pt and daughter thereafter.  Both verbalized understanding and agrees with this plan.  Advised the pts daughter that between  now and when Dr [name] advises on this, if the pt becomes symptomatic with LEE, SOB, DOE, chest pain, coughing, or has any other cardiac issues, then she should notify our office immediately, to obtain urgent orders by our DOD.  Daughter agreed to this plan.

## 2015-07-26 ENCOUNTER — Other Ambulatory Visit: Payer: Self-pay | Admitting: Family Medicine

## 2015-10-10 ENCOUNTER — Encounter: Payer: Self-pay | Admitting: Cardiology

## 2015-10-11 ENCOUNTER — Encounter: Payer: Self-pay | Admitting: Cardiology

## 2015-10-11 ENCOUNTER — Ambulatory Visit (INDEPENDENT_AMBULATORY_CARE_PROVIDER_SITE_OTHER): Payer: BLUE CROSS/BLUE SHIELD | Admitting: Cardiology

## 2015-10-11 VITALS — BP 122/64 | HR 72 | Ht 62.0 in

## 2015-10-11 DIAGNOSIS — E785 Hyperlipidemia, unspecified: Secondary | ICD-10-CM | POA: Diagnosis not present

## 2015-10-11 DIAGNOSIS — I10 Essential (primary) hypertension: Secondary | ICD-10-CM

## 2015-10-11 DIAGNOSIS — R002 Palpitations: Secondary | ICD-10-CM

## 2015-10-11 DIAGNOSIS — I493 Ventricular premature depolarization: Secondary | ICD-10-CM | POA: Diagnosis not present

## 2015-10-11 DIAGNOSIS — R072 Precordial pain: Secondary | ICD-10-CM

## 2015-10-11 DIAGNOSIS — R079 Chest pain, unspecified: Secondary | ICD-10-CM

## 2015-10-11 DIAGNOSIS — I5032 Chronic diastolic (congestive) heart failure: Secondary | ICD-10-CM

## 2015-10-11 DIAGNOSIS — G4730 Sleep apnea, unspecified: Secondary | ICD-10-CM

## 2015-10-11 MED ORDER — LISINOPRIL 20 MG PO TABS: 20.0000 mg | ORAL_TABLET | Freq: Every day | ORAL | 3 refills | Status: AC

## 2015-10-11 MED ORDER — ATORVASTATIN CALCIUM 40 MG PO TABS: 40.0000 mg | ORAL_TABLET | Freq: Every day | ORAL | 2 refills | Status: AC

## 2015-10-11 MED ORDER — ISOSORBIDE MONONITRATE ER 30 MG PO TB24: 15.0000 mg | ORAL_TABLET | Freq: Every day | ORAL | 6 refills | Status: DC

## 2015-10-11 MED ORDER — NITROGLYCERIN 0.4 MG SL SUBL: 0.4000 mg | SUBLINGUAL_TABLET | SUBLINGUAL | 6 refills | Status: AC | PRN

## 2015-10-11 MED ORDER — FUROSEMIDE 20 MG PO TABS: 20.0000 mg | ORAL_TABLET | Freq: Every day | ORAL | 3 refills | Status: DC

## 2015-10-11 MED ORDER — POTASSIUM CHLORIDE ER 10 MEQ PO TBCR: 10.0000 meq | EXTENDED_RELEASE_TABLET | Freq: Every day | ORAL | 3 refills | Status: AC

## 2015-10-11 NOTE — Progress Notes (Signed)
Patient ID: [name] AdasFelix Umland, female   DOB: October 10, 1955, 60 y.o.   MRN: [id number]    Patient Name: [name] [name] Date of Encounter: 10/11/2015  Primary Care Provider:  Laren BoomHommel, Sean, DO Primary Cardiologist:  Tobias AlexanderKatarina Rosmarie Esquibel  Problem List   Past Medical History:  Diagnosis Date  . High cholesterol   . Hypertension    Past Surgical History:  Procedure Laterality Date  . ABDOMINAL HYSTERECTOMY    . FOOT SURGERY    . SHOULDER SURGERY     Allergies  No Known Allergies  Chief complain: Chest pain  HPI  60 year old female with no prior cardiac history who has been experiencing palpitations and chest pain. She has been experiencing non-exertional chest pain associated with SOB and dizziness. The pain feels like pressure and is retrosternal. There is radiation to her arms. They have been happening in the last 4 years. No obvious aggravating or alleviating factor. Sometimes she feels them while at work ([employer]). No syncope, LE edema, orthopnea, PND.   The patient is of [place] origin and moved here recently from [place]. She has h/o well controlled HTN only. No FH of CAD. Very distant h/o smoking.  Informed the pt of her stress test being negative for ischemia per Dr [name].  Informed the pt that her LVEF was normal, however, mild LH and grade 2 DD, and there was significantly elevated BP during the stress test (202 mmHg), started on HCTZ 25 mg po daily.  She still has occassional chest pain and DOE. She was not taking HCTZ as she was concerned about cost.  05/27/2014 - the patient continues to complain for chest pain at night, can wake her up from sleep, about 3-4 /months for the last 6-7 night, also occassional PND. She is snoring and has pauses in breathing while she is asleep. She has mild DOE, no chest pain while walking. No palpitations or syncope.  08/29/14 - 3 months follow up, improved LE edema that s almost resolved, continues to have non-exertional tearing chest pains, improved DOE. No  palpitations or syncope. She underwent a sleep study and a CPAP was not recommended.  10/11/2015, this is a 1 year follow-up the patient states that she now has worsening chest pain with exertion and at rest is retrosternal sharp with some shortness of breath. She also feels crepitation with some degree of dizziness but no syncope. No lower extremity edema orthopnea or paroxysmal nocturnal dyspnea. She continues to snore and doesn't feel first in the morning. She ran out of her prescription medications.   Home Medications  Prior to Admission medications   Medication Sig Start Date End Date Taking? Authorizing Provider  aspirin 81 MG tablet Take 81 mg by mouth daily.   Yes Historical Provider, MD  benazepril (LOTENSIN) 20 MG tablet Take 20 mg by mouth daily.   Yes Historical Provider, MD  traZODone (DESYREL) 150 MG tablet Take 1 tablet by mouth daily. 08/09/13  Yes Historical Provider, MD   Family History  Family History  Problem Relation Age of Onset  . Diabetes Mother   . High blood pressure Mother    Social History  Social History   Social History  . Marital status: Single    Spouse name: N/A  . Number of children: N/A  . Years of education: N/A   Occupational History  . Not on file.   Social History Main Topics  . Smoking status: Never Smoker  . Smokeless tobacco: Never Used  . Alcohol use No  .  Drug use: No  . Sexual activity: Not on file   Other Topics Concern  . Not on file   Social History Narrative  . No narrative on file     Review of Systems, as per HPI, otherwise negative General:  No chills, fever, night sweats or weight changes.  Cardiovascular:  No chest pain, dyspnea on exertion, edema, orthopnea, palpitations, paroxysmal nocturnal dyspnea. Dermatological: No rash, lesions/masses Respiratory: No cough, dyspnea Urologic: No hematuria, dysuria Abdominal:   No nausea, vomiting, diarrhea, bright red blood per rectum, melena, or hematemesis Neurologic:   No visual changes, wkns, changes in mental status. All other systems reviewed and are otherwise negative except as noted above.  Physical Exam  Blood pressure 122/64, pulse 72, height 5\' 2"  (1.575 m).  General: Pleasant, NAD Psych: Normal affect. Neuro: Alert and oriented X 3. Moves all extremities spontaneously. HEENT: Normal  Neck: Supple without bruits or JVD. Lungs:  Resp regular and unlabored, CTA. Heart: RRR no s3, s4, or murmurs. Abdomen: Soft, non-tender, non-distended, BS + x 4.  Extremities: No clubbing, cyanosis or edema. DP/PT/Radials 2+ and equal bilaterally.  Labs:  No results for input(s): CKTOTAL, CKMB, TROPONINI in the last 72 hours. Lab Results  Component Value Date   WBC 8.3 08/12/2013   HGB 12.8 08/12/2013   HCT 36.4 08/12/2013   MCV 86.3 08/12/2013   PLT 262 08/12/2013    Lab Results  Component Value Date   DDIMER 0.36 08/12/2013   Invalid input(s): POCBNP    Component Value Date/Time   NA 142 08/23/2014 1349   K 3.6 08/23/2014 1349   CL 104 08/23/2014 1349   CO2 32 08/23/2014 1349   GLUCOSE 121 (H) 08/23/2014 1349   BUN 15 08/23/2014 1349   CREATININE 0.67 08/23/2014 1349   CALCIUM 9.5 08/23/2014 1349   PROT 7.1 08/30/2014 0952   ALBUMIN 4.4 08/30/2014 0952   AST 20 08/30/2014 0952   ALT 16 08/30/2014 0952   ALKPHOS 84 08/30/2014 0952   BILITOT 0.6 08/30/2014 0952   GFRNONAA >90 08/12/2013 1658   GFRAA >90 08/12/2013 1658   Lab Results  Component Value Date   CHOL 138 08/30/2014   Accessory Clinical Findings  Echocardiogram - 10/07/13  - Left ventricle: The cavity size was normal. Wall thickness was increased in a pattern of mild LVH. Systolic function was normal. The estimated ejection fraction was in the range of 60% to 65%. Features are consistent with a pseudonormal left ventricular filling pattern, with concomitant abnormal relaxation and increased filling pressure (grade 2 diastolic dysfunction). - Mitral valve: There was  mild regurgitation.  Stress echo 10/07/13  - Stress ECG conclusions: During peak stress there was 1mm of horizontal to upsloping ST segment depression in the inferolateral leads. During recovery there was 1mm of horizontal ST segment depression in the inferolateral leads. There were no stress arrhythmias or conduction abnormalities. Duke scoring: exercise time of 8 min; maximum ST deviation of 1 mm; no angina; resulting score is 3. This score predicts a moderate risk of cardiac events. - Staged echo: Normal echo stress  Impressions: - Normal study after maximal exercise with false positive EKG for ischemia.  ECG - SR, new negative T waves in the lateral leads.    Assessment & Plan  60 year old female   1. Chronic diastolic CHF - grade 2 DD, mild LVH, as part of the standard workup for unusually high grade of diastolic dysfunction in otherwise healthy young female, especially if she is  showing signs of snoring and obstructive sleep apnea needs to be evaluated as this is, significant  factor XL diastolic dysfunction and diastolic heart failure. She appears euvolemic.  2. Chest pain - negative stress test, however continues to have symptoms that are exertional. She has risk factors that include hyperlipidemia, obesity, impaired glucose tolerance. She also has very frequent PVCs and really concerned about ischemia. We will schedule a calcium score and coronary CTA to further evaluate.   3. Hypertensive heart disease without heart failure - well controlled.  4. Very frequent PVCs - we will first rule out significant coronary artery disease, if negative we'll consider Holter and management with beta blockers.  .4 Elevated blood glucose - HbA1c 5.3%  Draw CMP, lipids today, follow up in 2 months.  Tobias Alexander, MD, [facility] 10/11/2015, 9:42 AM

## 2015-10-11 NOTE — Patient Instructions (Addendum)
Medication Instructions:   Your physician recommends that you continue on your current medications as directed. Please refer to the Current Medication list given to you today.    Your physician recommends that you return for lab work in: IN THE NEAR FUTURE, PRIOR TO YOUR CORONARY CT TO CHECK A ---CMET AND LIPIDS---PLEASE COME FASTING TO THIS LAB APPOINTMENT    Testing/Procedures:  --[name] HAS ORDERED FOR YOU TO HAVE A CORONARY CT TO BE DONE FOR HER TO READ ON HER READER DAY---PER DR [name], THIS SHOULD NOT BE SCHEDULED UNTIL ITS PRE-CERTED AND APPROVED BY THE PATIENTS INSURANCE CARRIER---    Follow-Up:  2 MONTHS WITH DR [name]      If you need a refill on your cardiac medications before your next appointment, please call your pharmacy.

## 2015-10-11 NOTE — Addendum Note (Signed)
Addended by: Loa SocksMARTIN, IVY M on: 10/11/2015 11:07 AM   Modules accepted: Orders

## 2015-10-16 ENCOUNTER — Other Ambulatory Visit: Payer: BLUE CROSS/BLUE SHIELD | Admitting: *Deleted

## 2015-10-16 ENCOUNTER — Telehealth: Payer: Self-pay | Admitting: *Deleted

## 2015-10-16 DIAGNOSIS — R079 Chest pain, unspecified: Secondary | ICD-10-CM

## 2015-10-16 DIAGNOSIS — E7849 Other hyperlipidemia: Secondary | ICD-10-CM

## 2015-10-16 DIAGNOSIS — E785 Hyperlipidemia, unspecified: Secondary | ICD-10-CM | POA: Insufficient documentation

## 2015-10-16 LAB — COMPREHENSIVE METABOLIC PANEL
ALT: 18 U/L (ref 6–29)
AST: 18 U/L (ref 10–35)
Albumin: 4.3 g/dL (ref 3.6–5.1)
Alkaline Phosphatase: 83 U/L (ref 33–130)
BUN: 11 mg/dL (ref 7–25)
CO2: 30 mmol/L (ref 20–31)
Calcium: 9.6 mg/dL (ref 8.6–10.4)
Chloride: 105 mmol/L (ref 98–110)
Creat: 0.64 mg/dL (ref 0.50–1.05)
Glucose, Bld: 94 mg/dL (ref 65–99)
Potassium: 4.1 mmol/L (ref 3.5–5.3)
Sodium: 142 mmol/L (ref 135–146)
Total Bilirubin: 0.6 mg/dL (ref 0.2–1.2)
Total Protein: 6.8 g/dL (ref 6.1–8.1)

## 2015-10-16 LAB — LIPID PANEL
Cholesterol: 231 mg/dL — ABNORMAL HIGH (ref 125–200)
HDL: 57 mg/dL (ref >=46)
LDL Cholesterol: 135 mg/dL — ABNORMAL HIGH (ref <130)
Total CHOL/HDL Ratio: 4.1 Ratio (ref <=5.0)
Triglycerides: 193 mg/dL — ABNORMAL HIGH (ref <150)
VLDL: 39 mg/dL — ABNORMAL HIGH (ref <30)

## 2015-10-16 NOTE — Telephone Encounter (Signed)
Notified the pt that per Dr [name], her labs show normal electrolytes, kidney and liver function, however her cholesterol is significantly elevated.  Informed the pt that she should continue taking her atorvastatin that was restarted at her 9/27 OV with Dr [name], and we will recheck her lipids in 2 months, same day as her 11/29 OV with Dr [name].  Advised the pt that when she comes in for that lab appt, she should come fasting.  Pt verbalized understanding and agrees with this plan.

## 2015-10-16 NOTE — Telephone Encounter (Signed)
-----   Message from Lars MassonKatarina H Nelson, MD sent at 10/16/2015  5:41 PM EDT ----- normal including electrolytes, kidney, liver function, however significantly elevated lipids, atorvastatin was just restarted, we will repeat lipids in 2 months.

## 2015-10-17 ENCOUNTER — Telehealth: Payer: Self-pay | Admitting: Cardiology

## 2015-10-17 NOTE — Telephone Encounter (Signed)
Will send this message to Dr [name] as an Lorain Childesfyi.  This was pre-certed before scheduling, but the pt must pay her deductible.  Will have this rescheduled accordingly, when the pt would like to proceed with this.

## 2015-10-17 NOTE — Telephone Encounter (Signed)
10-17-15 Received a call from [facility] in scheduling at [facility] - she stated that [facility] spoke with [name] [name] and she cancel the Cardiac CT  schedule for 10-23-15 and will reschedule later after more of her deductible has been met.

## 2015-10-23 ENCOUNTER — Ambulatory Visit (HOSPITAL_COMMUNITY): Payer: BLUE CROSS/BLUE SHIELD

## 2015-11-17 DIAGNOSIS — R0982 Postnasal drip: Secondary | ICD-10-CM | POA: Insufficient documentation

## 2015-11-24 ENCOUNTER — Telehealth: Payer: Self-pay | Admitting: *Deleted

## 2015-11-24 NOTE — Telephone Encounter (Signed)
Pt cancelled hercardiac ct  for nelson to read her patient / r/s due to cancel  Received: Today  Message Contents  Sol BlazingSharon B Ferguson  Sharon B Ferguson; P Cv Div [facility] Pre Cert/Auth

## 2015-12-01 ENCOUNTER — Encounter: Payer: Self-pay | Admitting: Cardiology

## 2015-12-06 ENCOUNTER — Ambulatory Visit (HOSPITAL_COMMUNITY)
Admission: RE | Admit: 2015-12-06 | Discharge: 2015-12-06 | Disposition: A | Discharge status: Home / Self Care | Payer: BLUE CROSS/BLUE SHIELD | Source: Ambulatory Visit | Attending: Cardiology | Admitting: Cardiology

## 2015-12-06 DIAGNOSIS — I2510 Atherosclerotic heart disease of native coronary artery without angina pectoris: Secondary | ICD-10-CM | POA: Diagnosis not present

## 2015-12-06 DIAGNOSIS — R079 Chest pain, unspecified: Secondary | ICD-10-CM

## 2015-12-06 IMAGING — CT CT HEART MORP W/ CTA COR W/ SCORE W/ CA W/CM &/OR W/O CM
1 of 10 series · 1 of 20 positions shown, 2 images · IV contrast (Iodine)
Comparison: None.

EXAM:
OVER-READ INTERPRETATION  CT CHEST

The following report is an over-read performed by radiologist Dr.
AUJLA [REDACTED] on [DATE]. This over-read
does not include interpretation of cardiac or coronary anatomy or
pathology. The coronary CTA interpretation by the cardiologist is
attached.
CLINICAL DATA: 60 -year-old female with chest pain and negative
stress echocardiogram.
Cardiac/Coronary  CT
TECHNIQUE: The patient was scanned on a Philips 256 scanner.

[Series 300: locator · axial · 0.35mm/px · z∈[-144,-144]mm · 1 of 1 slices shown, 2 images]
[im 1/1  vessel]
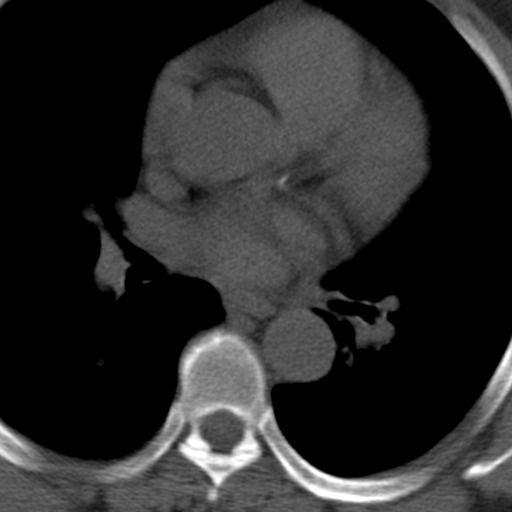
[im 1/1  lung]
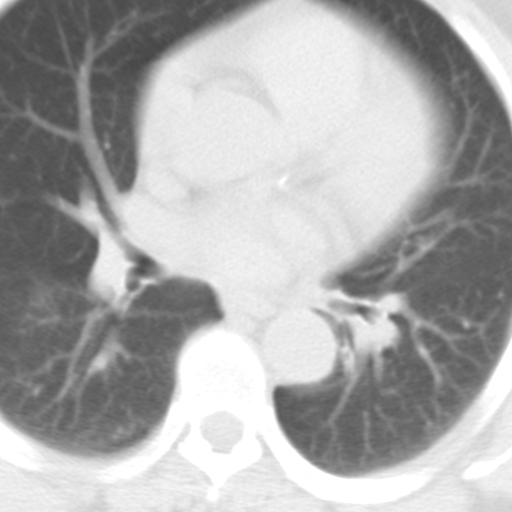

[1 of 20 positions shown; findings below may reference images not displayed]

FINDINGS: No adenopathy in the visualized mediastinum or hila. Visualized lung
fields are clear. No effusions. Visualized chest wall soft tissues
and upper abdomen unremarkable. No acute bony abnormality.
IMPRESSION: No acute or significant extra cardiac abnormality.
FINDINGS: A 120 kV prospective scan was triggered in the descending thoracic
aorta at 111 HU's. Axial non-contrast 3 mm slices were carried out
through the heart. The data set was analyzed on a dedicated work
station and scored using the Agatson method. Gantry rotation speed
was 270 msecs and collimation was .9 mm. No beta blockade and 0.8 mg
of sl NTG was given. The 3D data set was reconstructed in 5%
intervals of the 67-82 % of the R-R cycle. Diastolic phases were
analyzed on a dedicated work station using MPR, MIP and VRT modes.
The patient received 80 cc of contrast.

Aorta:  Normal size.  No calcifications.  No dissection.

Aortic Valve:  Trileaflet.  No calcifications.

Coronary Arteries:  Normal coronary origin.  Right dominance.

RCA is a large dominant artery that gives rise to PDA and PLVB.
There is diffuse mild mixed plaque with 0-25% stenosis in the
proximal RCA and 25-50% stenosis in the distal RCA.

Left main is a large artery that gives rise to LAD and LCX arteries.
There is no plaque.

LAD is a large vessel that wraps around the apex and gives rise to
two small diagonal branches. Ostial LAD has mild to moderate mixed
plaque with associated stenosis 50%.

LCX is a large caliber non-dominant artery that gives rise to three
obtuse marginal branches. There is minimal calcified plaque in mid
LCX artery associated with 0-25% stenosis.

Other findings:

Normal pulmonary vein drainage into the left atrium.

Normal let atrial appendage without a thrombus.

Normal size pulmonary artery.
IMPRESSION: 1. Coronary calcium score of 86. This was 91 percentile for age and
sex matched control.

2. Normal coronary origin with right dominance.

3. Mild to moderate CAD in the ostial LAD, minimal CAD in RCA and
LCX arteries. An aggressive risk factor modification is recommended.

AUJLA

## 2015-12-06 MED ORDER — IOPAMIDOL (ISOVUE-370) INJECTION 76%
INTRAVENOUS | Status: AC
  Administered 2015-12-06: 80 mL
  Filled 2015-12-06: qty 100

## 2015-12-06 MED ORDER — NITROGLYCERIN 0.4 MG SL SUBL
0.8000 mg | SUBLINGUAL_TABLET | Freq: Once | SUBLINGUAL | Status: AC
  Administered 2015-12-06: 0.8 mg via SUBLINGUAL

## 2015-12-06 MED ORDER — NITROGLYCERIN 0.4 MG SL SUBL
SUBLINGUAL_TABLET | SUBLINGUAL | Status: AC
  Filled 2015-12-06: qty 2

## 2015-12-13 ENCOUNTER — Other Ambulatory Visit: Payer: BLUE CROSS/BLUE SHIELD | Admitting: *Deleted

## 2015-12-13 ENCOUNTER — Ambulatory Visit (INDEPENDENT_AMBULATORY_CARE_PROVIDER_SITE_OTHER): Payer: BLUE CROSS/BLUE SHIELD | Admitting: Cardiology

## 2015-12-13 VITALS — BP 140/60 | HR 64 | Ht 62.0 in | Wt 148.0 lb

## 2015-12-13 DIAGNOSIS — E785 Hyperlipidemia, unspecified: Secondary | ICD-10-CM

## 2015-12-13 DIAGNOSIS — I2510 Atherosclerotic heart disease of native coronary artery without angina pectoris: Secondary | ICD-10-CM | POA: Diagnosis not present

## 2015-12-13 DIAGNOSIS — I10 Essential (primary) hypertension: Secondary | ICD-10-CM

## 2015-12-13 DIAGNOSIS — I493 Ventricular premature depolarization: Secondary | ICD-10-CM | POA: Diagnosis not present

## 2015-12-13 MED ORDER — HYDROCHLOROTHIAZIDE 25 MG PO TABS: 25.0000 mg | ORAL_TABLET | Freq: Every day | ORAL | 3 refills | Status: AC

## 2015-12-13 NOTE — Progress Notes (Signed)
Patient ID: [name] [name] Majano, female   DOB: Oct 27, 1955, 60 y.o.   MRN: [id number]    Patient Name: [name] [name] Hope Date of Encounter: 12/13/2015  Primary Care Provider:  Alain HoneyHERBER,RICHARD, MD Primary Cardiologist:  Tobias AlexanderKatarina Ednah Hammock  Problem List   Past Medical History:  Diagnosis Date  . High cholesterol   . Hypertension    Past Surgical History:  Procedure Laterality Date  . ABDOMINAL HYSTERECTOMY    . FOOT SURGERY    . SHOULDER SURGERY     Allergies  Allergies  Allergen Reactions  . Flonase [Fluticasone Propionate] Other (See Comments)    Caused nose to bleed daily    Chief complain: Chest pain  HPI  60 year old female with no prior cardiac history who has been experiencing palpitations and chest pain. She has been experiencing non-exertional chest pain associated with SOB and dizziness. The pain feels like pressure and is retrosternal. There is radiation to her arms. They have been happening in the last 4 years. No obvious aggravating or alleviating factor. Sometimes she feels them while at work ([employer]). No syncope, LE edema, orthopnea, PND.   The patient is of [place] origin and moved here recently from [place]. She has h/o well controlled HTN only. No FH of CAD. Very distant h/o smoking.  Informed the pt of her stress test being negative for ischemia per Dr [name].  Informed the pt that her LVEF was normal, however, mild LH and grade 2 DD, and there was significantly elevated BP during the stress test (202 mmHg), started on HCTZ 25 mg po daily.  She still has occassional chest pain and DOE. She was not taking HCTZ as she was concerned about cost.  05/27/2014 - the patient continues to complain for chest pain at night, can wake her up from sleep, about 3-4 /months for the last 6-7 night, also occassional PND. She is snoring and has pauses in breathing while she is asleep. She has mild DOE, no chest pain while walking. No palpitations or syncope.  08/29/14 - 3 months follow up, improved  LE edema that s almost resolved, continues to have non-exertional tearing chest pains, improved DOE. No palpitations or syncope. She underwent a sleep study and a CPAP was not recommended.  10/11/2015, this is a 1 year follow-up the patient states that she now has worsening chest pain with exertion and at rest is retrosternal sharp with some shortness of breath. She also feels crepitation with some degree of dizziness but no syncope. No lower extremity edema orthopnea or paroxysmal nocturnal dyspnea. She continues to snore and doesn't feel first in the morning. She ran out of her prescription medications.  12/13/2015 - the patient is coming after 2 months, she underwent coronary CT, with coronary calcium 86 but is 91 percentile. Her coronary CT showed mild to moderate CAD on steel LAD and minimal disease in RCA and left circumflex arteries. She isn't experiencing any more chest pain but gets short of breath when she walks the stairs. She has started some exercise has to stop after about 20 minutes. Denies any palpitations or syncope. She has noticed lower extremity edema.   Home Medications  Prior to Admission medications   Medication Sig Start Date End Date Taking? Authorizing Provider  aspirin 81 MG tablet Take 81 mg by mouth daily.   Yes Historical Provider, MD  benazepril (LOTENSIN) 20 MG tablet Take 20 mg by mouth daily.   Yes Historical Provider, MD  traZODone (DESYREL) 150 MG tablet Take 1 tablet  by mouth daily. 08/09/13  Yes Historical Provider, MD   Family History  Family History  Problem Relation Age of Onset  . Diabetes Mother   . High blood pressure Mother    Social History  Social History   Social History  . Marital status: Single    Spouse name: N/A  . Number of children: N/A  . Years of education: N/A   Occupational History  . Not on file.   Social History Main Topics  . Smoking status: Never Smoker  . Smokeless tobacco: Never Used  . Alcohol use No  . Drug use:  No  . Sexual activity: Not on file   Other Topics Concern  . Not on file   Social History Narrative  . No narrative on file     Review of Systems, as per HPI, otherwise negative General:  No chills, fever, night sweats or weight changes.  Cardiovascular:  No chest pain, dyspnea on exertion, edema, orthopnea, palpitations, paroxysmal nocturnal dyspnea. Dermatological: No rash, lesions/masses Respiratory: No cough, dyspnea Urologic: No hematuria, dysuria Abdominal:   No nausea, vomiting, diarrhea, bright red blood per rectum, melena, or hematemesis Neurologic:  No visual changes, wkns, changes in mental status. All other systems reviewed and are otherwise negative except as noted above.  Physical Exam  Blood pressure 140/60, pulse 64, height 5\' 2"  (1.575 m), weight 148 lb (67.1 kg).  General: Pleasant, NAD Psych: Normal affect. Neuro: Alert and oriented X 3. Moves all extremities spontaneously. HEENT: Normal  Neck: Supple without bruits or JVD. Lungs:  Resp regular and unlabored, CTA. Heart: RRR no s3, s4, or murmurs. Abdomen: Soft, non-tender, non-distended, BS + x 4.  Extremities: No clubbing, cyanosis or edema. DP/PT/Radials 2+ and equal bilaterally.  Labs:  No results for input(s): CKTOTAL, CKMB, TROPONINI in the last 72 hours. Lab Results  Component Value Date   WBC 8.3 08/12/2013   HGB 12.8 08/12/2013   HCT 36.4 08/12/2013   MCV 86.3 08/12/2013   PLT 262 08/12/2013    Lab Results  Component Value Date   DDIMER 0.36 08/12/2013   Invalid input(s): POCBNP    Component Value Date/Time   NA 142 10/16/2015 0937   K 4.1 10/16/2015 0937   CL 105 10/16/2015 0937   CO2 30 10/16/2015 0937   GLUCOSE 94 10/16/2015 0937   BUN 11 10/16/2015 0937   CREATININE 0.64 10/16/2015 0937   CALCIUM 9.6 10/16/2015 0937   PROT 6.8 10/16/2015 0937   ALBUMIN 4.3 10/16/2015 0937   AST 18 10/16/2015 0937   ALT 18 10/16/2015 0937   ALKPHOS 83 10/16/2015 0937   BILITOT 0.6  10/16/2015 0937   GFRNONAA >90 08/12/2013 1658   GFRAA >90 08/12/2013 1658   Lab Results  Component Value Date   CHOL 231 (H) 10/16/2015   Accessory Clinical Findings  Echocardiogram - 10/07/13  - Left ventricle: The cavity size was normal. Wall thickness was increased in a pattern of mild LVH. Systolic function was normal. The estimated ejection fraction was in the range of 60% to 65%. Features are consistent with a pseudonormal left ventricular filling pattern, with concomitant abnormal relaxation and increased filling pressure (grade 2 diastolic dysfunction). - Mitral valve: There was mild regurgitation.  Stress echo 10/07/13  - Stress ECG conclusions: During peak stress there was 1mm of horizontal to upsloping ST segment depression in the inferolateral leads. During recovery there was 1mm of horizontal ST segment depression in the inferolateral leads. There were no stress arrhythmias or  conduction abnormalities. [facility] scoring: exercise time of 8 min; maximum ST deviation of 1 mm; no angina; resulting score is 3. This score predicts a moderate risk of cardiac events. - Staged echo: Normal echo stress  Impressions: - Normal study after maximal exercise with false positive EKG for ischemia.  ECG - SR, new negative T waves in the lateral leads.    Assessment & Plan  60 year old female   1. Chronic diastolic CHF - grade 2 DD, mild LVH, as part of the standard workup for unusually high grade of diastolic dysfunction in otherwise healthy young female, especially if she is showing signs of snoring and obstructive sleep apnea needs to be evaluated as this is, significant  factor XL diastolic dysfunction and diastolic heart failure. She has mild lower extremity edema she hasn't been using her Lasix, we'll start hydrocortisone 25 mg daily.   2. Chest pain - negative stress test, her coronary CT showed mild to moderate ostial LAD disease she's been starting atorvastatin 40 mg  daily. We will recheck her lipids with CMP. She is advised to assess size of the regular basis.   3. Hypertensive heart disease without heart failure - well controlled.  4. Very frequent PVCs - she has no obstructive CAD currently denies significant palpitations and her baseline heart rate is 64 so we will hold off on beta blockers. In the future if she has significant dictations we will schedule Holter monitor.  Follow-up in 6 months.  Tobias AlexanderKatarina Junetta Hearn, MD, [facility] 12/13/2015, 11:20 AM

## 2015-12-13 NOTE — Patient Instructions (Signed)
Medication Instructions:   STOP TAKING LASIX (FUROSEMIDE) NOW  START TAKING HYDROCHLOROTHIAZIDE 25 MG ONCE DAILY    Labwork:  IN THE NEXT WEEK OR SO TO CHECK ---CMET, CBC W DIFF, AND LIPIDS---PLEASE MAKE SURE YOU COME FASTING TO THIS LAB APPOINTMENT    Follow-Up:  Your physician wants you to follow-up in: 6 MONTHS WITH DR Johnell ComingsNELSON You will receive a reminder letter in the mail two months in advance. If you don't receive a letter, please call our office to schedule the follow-up appointment.       If you need a refill on your cardiac medications before your next appointment, please call your pharmacy.

## 2015-12-18 ENCOUNTER — Other Ambulatory Visit: Payer: BLUE CROSS/BLUE SHIELD

## 2015-12-20 ENCOUNTER — Other Ambulatory Visit (INDEPENDENT_AMBULATORY_CARE_PROVIDER_SITE_OTHER): Payer: BLUE CROSS/BLUE SHIELD

## 2015-12-20 DIAGNOSIS — I10 Essential (primary) hypertension: Secondary | ICD-10-CM

## 2015-12-20 DIAGNOSIS — E785 Hyperlipidemia, unspecified: Secondary | ICD-10-CM | POA: Diagnosis not present

## 2015-12-20 LAB — COMPREHENSIVE METABOLIC PANEL
ALT: 14 U/L (ref 6–29)
AST: 16 U/L (ref 10–35)
Albumin: 4.4 g/dL (ref 3.6–5.1)
Alkaline Phosphatase: 91 U/L (ref 33–130)
BUN: 17 mg/dL (ref 7–25)
CO2: 28 mmol/L (ref 20–31)
Calcium: 9.5 mg/dL (ref 8.6–10.4)
Chloride: 104 mmol/L (ref 98–110)
Creat: 0.64 mg/dL (ref 0.50–1.05)
Glucose, Bld: 97 mg/dL (ref 65–99)
Potassium: 4.2 mmol/L (ref 3.5–5.3)
Sodium: 141 mmol/L (ref 135–146)
Total Bilirubin: 0.7 mg/dL (ref 0.2–1.2)
Total Protein: 6.8 g/dL (ref 6.1–8.1)

## 2015-12-20 LAB — LIPID PANEL
Cholesterol: 114 mg/dL (ref <200)
HDL: 60 mg/dL (ref >50)
LDL Cholesterol: 33 mg/dL (ref <100)
Total CHOL/HDL Ratio: 1.9 Ratio (ref <5.0)
Triglycerides: 106 mg/dL (ref <150)
VLDL: 21 mg/dL (ref <30)

## 2015-12-20 LAB — CBC WITH DIFFERENTIAL/PLATELET
Basophils Absolute: 51 cells/uL (ref 0–200)
Basophils Relative: 1 %
Eosinophils Absolute: 153 cells/uL (ref 15–500)
Eosinophils Relative: 3 %
HCT: 37.5 % (ref 35.0–45.0)
Hemoglobin: 12.8 g/dL (ref 11.7–15.5)
Lymphocytes Relative: 37 %
Lymphs Abs: 1887 cells/uL (ref 850–3900)
MCH: 30 pg (ref 27.0–33.0)
MCHC: 34.1 g/dL (ref 32.0–36.0)
MCV: 87.8 fL (ref 80.0–100.0)
MPV: 9.3 fL (ref 7.5–12.5)
Monocytes Absolute: 459 cells/uL (ref 200–950)
Monocytes Relative: 9 %
Neutro Abs: 2550 cells/uL (ref 1500–7800)
Neutrophils Relative %: 50 %
Platelets: 249 10*3/uL (ref 140–400)
RBC: 4.27 MIL/uL (ref 3.80–5.10)
RDW: 13.5 % (ref 11.0–15.0)
WBC: 5.1 10*3/uL (ref 3.8–10.8)

## 2016-01-02 ENCOUNTER — Other Ambulatory Visit: Payer: Self-pay | Admitting: Cardiology
